# Patient Record
Sex: Male | Born: 1990 | Race: White | Hispanic: No | Marital: Single | Smoking: Current every day smoker
Health system: Southern US, Community
[De-identification: ages and names within clinical notes are randomized; demographics above are authoritative.]

## PROBLEM LIST (undated history)

## (undated) HISTORY — PX: SPLENECTOMY: SUR1306

## (undated) HISTORY — PX: SPINE SURGERY: SHX786

---
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
## 2011-03-18 LAB — COMPREHENSIVE METABOLIC PANEL
AST: 28
BUN: 8
Calcium: 9.7
Chloride: 104
Creatinine, Ser: 0.94
Potassium: 3.5
Total Protein: 6.8

## 2011-03-18 LAB — CBC
HCT: 44.3
Hemoglobin: 15.3
MCHC: 34.5
RBC: 4.82
WBC: 16.2 — ABNORMAL HIGH

## 2011-03-18 LAB — DIFFERENTIAL
Eosinophils Relative: 0
Lymphs Abs: 1.2
Monocytes Relative: 3
Neutro Abs: 14.4 — ABNORMAL HIGH
Neutrophils Relative %: 89 — ABNORMAL HIGH

## 2011-04-01 LAB — DIFFERENTIAL
Basophils Absolute: 0
Basophils Absolute: 0
Basophils Relative: 0
Eosinophils Absolute: 0
Eosinophils Relative: 0
Eosinophils Relative: 2
Eosinophils Relative: 5
Lymphocytes Relative: 10 — ABNORMAL LOW
Lymphocytes Relative: 23 — ABNORMAL LOW
Lymphocytes Relative: 24
Lymphs Abs: 1.7
Lymphs Abs: 2.3
Monocytes Absolute: 0.5
Monocytes Absolute: 1.1
Monocytes Relative: 5
Monocytes Relative: 7
Neutro Abs: 19.9 — ABNORMAL HIGH
Neutro Abs: 4.1
Neutrophils Relative %: 67
Neutrophils Relative %: 85 — ABNORMAL HIGH

## 2011-04-01 LAB — CBC
HCT: 25.4 — ABNORMAL LOW
HCT: 25.6 — ABNORMAL LOW
HCT: 28.1 — ABNORMAL LOW
HCT: 40.1
Hemoglobin: 11 — ABNORMAL LOW
Hemoglobin: 13.9
Hemoglobin: 8.6 — ABNORMAL LOW
Hemoglobin: 8.9 — ABNORMAL LOW
MCHC: 34.2
MCHC: 34.5
MCHC: 34.7
MCV: 89
MCV: 90
MCV: 90.1
MCV: 93.4
Platelets: 271
Platelets: 556 — ABNORMAL HIGH
RBC: 2.81 — ABNORMAL LOW
RBC: 3.56 — ABNORMAL LOW
RBC: 4.3
RDW: 13.2
RDW: 14.7 — ABNORMAL HIGH
RDW: 15.6 — ABNORMAL HIGH
WBC: 23.3 — ABNORMAL HIGH
WBC: 7.2

## 2011-04-01 LAB — CROSSMATCH
ABO/RH(D): O POS
Antibody Screen: NEGATIVE

## 2011-04-01 LAB — HEMOGLOBIN AND HEMATOCRIT, BLOOD
HCT: 26 — ABNORMAL LOW
HCT: 33.8 — ABNORMAL LOW
Hemoglobin: 11.6 — ABNORMAL LOW
Hemoglobin: 8.8 — ABNORMAL LOW

## 2011-04-01 LAB — BASIC METABOLIC PANEL
CO2: 24
Chloride: 103
Glucose, Bld: 180 — ABNORMAL HIGH
Potassium: 3.3 — ABNORMAL LOW
Sodium: 138

## 2011-04-01 LAB — ETHANOL: Alcohol, Ethyl (B): <5

## 2011-04-01 LAB — POCT I-STAT 4, (NA,K, GLUC, HGB,HCT)
Glucose, Bld: 142 — ABNORMAL HIGH
HCT: 25 — ABNORMAL LOW
Hemoglobin: 12.2
Hemoglobin: 6.8 — CL
Hemoglobin: 8.5 — ABNORMAL LOW
Operator id: 114421
Potassium: 3.9
Sodium: 135
Sodium: 139
Sodium: 139

## 2011-12-31 ENCOUNTER — Ambulatory Visit: Payer: BC Managed Care – PPO

## 2011-12-31 ENCOUNTER — Ambulatory Visit (INDEPENDENT_AMBULATORY_CARE_PROVIDER_SITE_OTHER): Payer: BC Managed Care – PPO | Admitting: Family Medicine

## 2011-12-31 VITALS — BP 102/68 | HR 62 | Temp 98.2°F | Resp 16 | Ht 69.5 in | Wt 138.0 lb

## 2011-12-31 DIAGNOSIS — M549 Dorsalgia, unspecified: Secondary | ICD-10-CM

## 2011-12-31 DIAGNOSIS — M62838 Other muscle spasm: Secondary | ICD-10-CM

## 2011-12-31 MED ORDER — MELOXICAM 7.5 MG PO TABS: 7.5000 mg | ORAL_TABLET | Freq: Every day | ORAL | Status: DC
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
## 2011-12-31 NOTE — Patient Instructions (Signed)
Exercises as in manual, recheck in next 3-4 weeks if not improving, sooner if worse.

## 2012-01-09 ENCOUNTER — Telehealth: Payer: Self-pay

## 2012-01-09 NOTE — Telephone Encounter (Signed)
LMOM to CB--see xray report.

## 2012-01-09 NOTE — Telephone Encounter (Signed)
Patient states that he is returning our phone call regarding his xray report.      Best #: [phone]

## 2012-03-04 ENCOUNTER — Emergency Department (HOSPITAL_COMMUNITY): Payer: BC Managed Care – PPO

## 2012-03-04 ENCOUNTER — Encounter (HOSPITAL_COMMUNITY): Payer: Self-pay | Admitting: Family Medicine

## 2012-03-04 ENCOUNTER — Emergency Department (HOSPITAL_COMMUNITY)
Admission: EM | Admit: 2012-03-04 | Discharge: 2012-03-04 | Disposition: A | Discharge status: Home / Self Care | Payer: BC Managed Care – PPO | Attending: Emergency Medicine | Admitting: Emergency Medicine

## 2012-03-04 DIAGNOSIS — F17200 Nicotine dependence, unspecified, uncomplicated: Secondary | ICD-10-CM | POA: Insufficient documentation

## 2012-03-04 DIAGNOSIS — S022XXA Fracture of nasal bones, initial encounter for closed fracture: Secondary | ICD-10-CM

## 2012-03-04 DIAGNOSIS — S0990XA Unspecified injury of head, initial encounter: Secondary | ICD-10-CM

## 2012-03-04 MED ORDER — PSEUDOEPHEDRINE HCL 60 MG PO TABS: 60.0000 mg | ORAL_TABLET | Freq: Four times a day (QID) | ORAL | Status: AC | PRN

## 2012-03-04 MED ORDER — IBUPROFEN 800 MG PO TABS
800.0000 mg | ORAL_TABLET | Freq: Once | ORAL | Status: AC
  Administered 2012-03-04: 800 mg via ORAL
  Filled 2012-03-04: qty 1

## 2012-03-04 MED ORDER — OXYCODONE-ACETAMINOPHEN 5-325 MG PO TABS
2.0000 | ORAL_TABLET | Freq: Once | ORAL | Status: AC
  Administered 2012-03-04: 2 via ORAL
  Filled 2012-03-04: qty 2

## 2012-03-04 MED ORDER — TETANUS-DIPHTH-ACELL PERTUSSIS 5-2.5-18.5 LF-MCG/0.5 IM SUSP: 0.5000 mL | Freq: Once | INTRAMUSCULAR | Status: DC

## 2012-03-04 MED ORDER — NAPROXEN 500 MG PO TABS: 500.0000 mg | ORAL_TABLET | Freq: Two times a day (BID) | ORAL | Status: DC | PRN

## 2012-03-04 MED ORDER — OXYCODONE-ACETAMINOPHEN 5-325 MG PO TABS: 1.0000 | ORAL_TABLET | ORAL | Status: AC | PRN

## 2012-03-04 NOTE — ED Notes (Signed)
C-collar applied
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
## 2012-03-04 NOTE — ED Notes (Signed)
Went to bedside to medicate patient. Patient found not to have C-collar on. Patient states he removed C-collar because he just didn't like it.

## 2012-03-04 NOTE — ED Notes (Signed)
Returned from CT.

## 2012-03-04 NOTE — ED Notes (Signed)
Kohut, MD at bedside. 

## 2012-03-04 NOTE — ED Notes (Signed)
Patient transported to CT 
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
## 2012-07-06 ENCOUNTER — Ambulatory Visit (INDEPENDENT_AMBULATORY_CARE_PROVIDER_SITE_OTHER): Payer: BC Managed Care – PPO | Admitting: Physician Assistant

## 2012-07-06 VITALS — BP 97/63 | HR 90 | Temp 98.3°F | Resp 16 | Ht 69.0 in | Wt 138.6 lb

## 2012-07-06 DIAGNOSIS — R059 Cough, unspecified: Secondary | ICD-10-CM

## 2012-07-06 DIAGNOSIS — J40 Bronchitis, not specified as acute or chronic: Secondary | ICD-10-CM

## 2012-07-06 DIAGNOSIS — J3489 Other specified disorders of nose and nasal sinuses: Secondary | ICD-10-CM

## 2012-07-06 DIAGNOSIS — R05 Cough: Secondary | ICD-10-CM

## 2012-07-06 DIAGNOSIS — R0981 Nasal congestion: Secondary | ICD-10-CM

## 2012-07-06 MED ORDER — HYDROCODONE-HOMATROPINE 5-1.5 MG/5ML PO SYRP: 5.0000 mL | ORAL_SOLUTION | Freq: Three times a day (TID) | ORAL | Status: DC | PRN

## 2012-07-06 MED ORDER — AZITHROMYCIN 250 MG PO TABS: ORAL_TABLET | ORAL | Status: DC

## 2012-07-06 MED ORDER — IPRATROPIUM BROMIDE 0.03 % NA SOLN: 2.0000 | Freq: Two times a day (BID) | NASAL | Status: DC

## 2012-07-06 NOTE — Patient Instructions (Addendum)
Delsym twice daily for cough  Ibuprofen or tylenol as needed for chills Continue Mucinex as directed

## 2012-07-06 NOTE — Progress Notes (Signed)
  Subjective:    Patient ID: [name], male    DOB: 02/14/1991, 22 y.o.   MRN: [id number]  HPI 22 year old male presents with 1 week history of nasal congestion, cough, fever, chills, and body aches. States symptoms started suddenly and have persisted throughout the week. He does admit that his fever and chills were much worse at the onset, as well as a cough with burning pain in his chest.  States he does continue to have nasal congestion, postnasal drainage, rhinorrhea, and dry cough.  Denies nausea, vomiting, headache, otalgia, or dizziness. No known flu contacts.  He is in school at [employer].  He is otherwise healthy and has not other concerns today.     Review of Systems  Constitutional: Positive for chills. Negative for fever.  HENT: Positive for congestion, sore throat, rhinorrhea, postnasal drip and sinus pressure. Negative for ear pain, neck pain and neck stiffness.   Respiratory: Positive for cough. Negative for shortness of breath and wheezing.   Cardiovascular: Negative for chest pain.  Gastrointestinal: Negative for nausea, vomiting and abdominal pain.  Skin: Negative for rash.  Neurological: Negative for dizziness and headaches.  All other systems reviewed and are negative.       Objective:   Physical Exam  Constitutional: He is oriented to person, place, and time. He appears well-developed and well-nourished.  HENT:  Head: Normocephalic and atraumatic.  Right Ear: Hearing, tympanic membrane, external ear and ear canal normal.  Left Ear: Hearing, tympanic membrane, external ear and ear canal normal.  Mouth/Throat: Uvula is midline and mucous membranes are normal. Posterior oropharyngeal erythema (no tonsillar swelling) present. No oropharyngeal exudate, posterior oropharyngeal edema or tonsillar abscesses.  Eyes: Conjunctivae normal are normal.  Neck: Normal range of motion. Neck supple.  Cardiovascular: Normal rate, regular rhythm and normal heart  sounds.   Pulmonary/Chest: Effort normal and breath sounds normal.  Lymphadenopathy:    He has no cervical adenopathy.  Neurological: He is alert and oriented to person, place, and time.  Psychiatric: He has a normal mood and affect. His behavior is normal. Judgment and thought content normal.          Assessment & Plan:   1. Cough  HYDROcodone-homatropine (HYCODAN) 5-1.5 MG/5ML syrup  2. Bronchitis  azithromycin (ZITHROMAX) 250 MG tablet  3. Nasal congestion  ipratropium (ATROVENT) 0.03 % nasal spray   Likely had influenza last week with persistent postviral cough or possible bronchitis Will go ahead and treat with Zpack Atrovent NS bid Hycodan qhs prn cough Continue Mucinex as directed Follow up if symptoms worsen or fail to improve.

## 2012-07-26 ENCOUNTER — Encounter: Payer: Self-pay | Admitting: Emergency Medicine

## 2012-07-26 ENCOUNTER — Ambulatory Visit (INDEPENDENT_AMBULATORY_CARE_PROVIDER_SITE_OTHER): Payer: BC Managed Care – PPO | Admitting: Emergency Medicine

## 2012-07-26 VITALS — BP 114/68 | HR 77 | Temp 99.2°F | Resp 18 | Wt 138.0 lb

## 2012-07-26 DIAGNOSIS — K2100 Gastro-esophageal reflux disease with esophagitis, without bleeding: Secondary | ICD-10-CM

## 2012-07-26 DIAGNOSIS — D7389 Other diseases of spleen: Secondary | ICD-10-CM

## 2012-07-26 DIAGNOSIS — L819 Disorder of pigmentation, unspecified: Secondary | ICD-10-CM

## 2012-07-26 DIAGNOSIS — S3609XA Other injury of spleen, initial encounter: Secondary | ICD-10-CM

## 2012-07-26 MED ORDER — PANTOPRAZOLE SODIUM 40 MG PO TBEC: 40.0000 mg | DELAYED_RELEASE_TABLET | Freq: Every day | ORAL | Status: DC

## 2012-07-26 NOTE — Progress Notes (Signed)
  Subjective:    Patient ID: [name], male    DOB: 04/21/1991, 22 y.o.   MRN: [id number]  HPI patient with 3 issues. He is a history of substance abuse. He denies using any drugs at the present time. His strength about once a week he denies using marijuana or any hard drugs. Problem #2 his reflux. He has significant trouble with heartburn especially after eating. He states whenever he he eats he has a burning sensation and if he tries to treat his burning sensation with food it makes it worse. He does get relief when he takes Zantac or antacids . He denies taking any nonsteroidals. Problem #3 is discoloration around his nose. He states the areas been discolored since he suffered a nasal fracture last year. Problem #4 smoking. Patient smokes one half pack a day.    Review of Systems     Objective:   Physical Exam there is skin discoloration and nasal area. TMs are clear nose normal throat was clear chest clear to auscultation and percussion abdomen shows a large midline scar there no tenderness or masses felt        Assessment & Plan:  He is advised to quit smoking. He started Protonix 40 mg one a day. He's referred to GI for consideration for endoscopy. He also is referred to dermatology to see if they can help with his skin discoloration around his nose

## 2013-05-12 ENCOUNTER — Ambulatory Visit (INDEPENDENT_AMBULATORY_CARE_PROVIDER_SITE_OTHER): Payer: BC Managed Care – PPO | Admitting: Emergency Medicine

## 2013-05-12 ENCOUNTER — Ambulatory Visit: Payer: BC Managed Care – PPO

## 2013-05-12 VITALS — BP 126/84 | HR 107 | Temp 98.3°F | Resp 16 | Ht 69.5 in | Wt 139.8 lb

## 2013-05-12 DIAGNOSIS — S6991XA Unspecified injury of right wrist, hand and finger(s), initial encounter: Secondary | ICD-10-CM

## 2013-05-12 DIAGNOSIS — S6990XA Unspecified injury of unspecified wrist, hand and finger(s), initial encounter: Secondary | ICD-10-CM

## 2013-05-12 DIAGNOSIS — S61409A Unspecified open wound of unspecified hand, initial encounter: Secondary | ICD-10-CM

## 2013-05-12 DIAGNOSIS — S61411A Laceration without foreign body of right hand, initial encounter: Secondary | ICD-10-CM

## 2013-05-12 DIAGNOSIS — M79609 Pain in unspecified limb: Secondary | ICD-10-CM

## 2013-05-12 NOTE — Progress Notes (Signed)
Patient ID: ELOHIM BRUNE MRN: [id number], DOB: January 22, 1991, 22 y.o. Date of Encounter: 05/12/2013, 6:16 PM   PROCEDURE NOTE: Verbal consent obtained. Sterile technique employed. Numbing: Anesthesia obtained with 2% lidocaine plain.   Cleansed with soap and water. Irrigated.  Wound explored, no deep structures involved, no foreign bodies.   Extensor tendon visualized, but no laceration of tendon noted.   Wound repaired with # 2 HM and #4 SI sutures.  Hemostasis obtained. Wound cleansed and dressed.  Wound care instructions including precautions covered with patient. Handout given.  Anticipate suture removal in 10 days  Rhoderick Moody, PA-C 05/12/2013 6:16 PM

## 2013-05-12 NOTE — Patient Instructions (Addendum)
WOUND CARE Please return in 10 days to have your stitches/staples removed or sooner if you have concerns. Marland Kitchen Keep area clean and dry for 24 hours. Do not remove bandage, if applied. . After 24 hours, remove bandage and wash wound gently with mild soap and warm water. Reapply a new bandage after cleaning wound, if directed. . Continue daily cleansing with soap and water until stitches/staples are removed. . Do not apply any ointments or creams to the wound while stitches/staples are in place, as this may cause delayed healing. . Notify the office if you experience any of the following signs of infection: Swelling, redness, pus drainage, streaking, fever >101.0 F . Notify the office if you experience excessive bleeding that does not stop after 15-20 minutes of constant, firm pressure.         Laceration Care, Adult A laceration is a cut or lesion that goes through all layers of the skin and into the tissue just beneath the skin. TREATMENT  Some lacerations may not require closure. Some lacerations may not be able to be closed due to an increased risk of infection. It is important to see your caregiver as soon as possible after an injury to minimize the risk of infection and maximize the opportunity for successful closure. If closure is appropriate, pain medicines may be given, if needed. The wound will be cleaned to help prevent infection. Your caregiver will use stitches (sutures), staples, wound glue (adhesive), or skin adhesive strips to repair the laceration. These tools bring the skin edges together to allow for faster healing and a better cosmetic outcome. However, all wounds will heal with a scar. Once the wound has healed, scarring can be minimized by covering the wound with sunscreen during the day for 1 full year. HOME CARE INSTRUCTIONS  For sutures or staples:  Keep the wound clean and dry.  If you were given a bandage (dressing), you should change it at least once a  day. Also, change the dressing if it becomes wet or dirty, or as directed by your caregiver.  Wash the wound with soap and water 2 times a day. Rinse the wound off with water to remove all soap. Pat the wound dry with a clean towel.  After cleaning, apply a thin layer of the antibiotic ointment as recommended by your caregiver. This will help prevent infection and keep the dressing from sticking.  You may shower as usual after the first 24 hours. Do not soak the wound in water until the sutures are removed.  Only take over-the-counter or prescription medicines for pain, discomfort, or fever as directed by your caregiver.  Get your sutures or staples removed as directed by your caregiver. For skin adhesive strips:  Keep the wound clean and dry.  Do not get the skin adhesive strips wet. You may bathe carefully, using caution to keep the wound dry.  If the wound gets wet, pat it dry with a clean towel.  Skin adhesive strips will fall off on their own. You may trim the strips as the wound heals. Do not remove skin adhesive strips that are still stuck to the wound. They will fall off in time. For wound adhesive:  You may briefly wet your wound in the shower or bath. Do not soak or scrub the wound. Do not swim. Avoid periods of heavy perspiration until the skin adhesive has fallen off on its own. After showering or bathing, gently pat the wound dry with a clean towel.  Do not  apply liquid medicine, cream medicine, or ointment medicine to your wound while the skin adhesive is in place. This may loosen the film before your wound is healed.  If a dressing is placed over the wound, be careful not to apply tape directly over the skin adhesive. This may cause the adhesive to be pulled off before the wound is healed.  Avoid prolonged exposure to sunlight or tanning lamps while the skin adhesive is in place. Exposure to ultraviolet light in the first year will darken the scar.  The skin adhesive will  usually remain in place for 5 to 10 days, then naturally fall off the skin. Do not pick at the adhesive film. You may need a tetanus shot if:  You cannot remember when you had your last tetanus shot.  You have never had a tetanus shot. If you get a tetanus shot, your arm may swell, get red, and feel warm to the touch. This is common and not a problem. If you need a tetanus shot and you choose not to have one, there is a rare chance of getting tetanus. Sickness from tetanus can be serious. SEEK MEDICAL CARE IF:   You have redness, swelling, or increasing pain in the wound.  You see a red line that goes away from the wound.  You have yellowish-white fluid (pus) coming from the wound.  You have a fever.  You notice a bad smell coming from the wound or dressing.  Your wound breaks open before or after sutures have been removed.  You notice something coming out of the wound such as wood or glass.  Your wound is on your hand or foot and you cannot move a finger or toe. SEEK IMMEDIATE MEDICAL CARE IF:   Your pain is not controlled with prescribed medicine.  You have severe swelling around the wound causing pain and numbness or a change in color in your arm, hand, leg, or foot.  Your wound splits open and starts bleeding.  You have worsening numbness, weakness, or loss of function of any joint around or beyond the wound.  You develop painful lumps near the wound or on the skin anywhere on your body. MAKE SURE YOU:   Understand these instructions.  Will watch your condition.  Will get help right away if you are not doing well or get worse. Document Released: 06/08/2005 Document Revised: 08/31/2011 Document Reviewed: 12/02/2010 [facility] Patient Information 2014 [place], [place].

## 2013-05-12 NOTE — Progress Notes (Signed)
[facility] [address] [place] [address] [phone]  Date:  05/12/2013   Name:  [name]   DOB:  [birth year]   MRN:  [id number]  PCP:  No primary provider on file.    Chief Complaint: Finger Injury   History of Present Illness:  [name] is a 22 y.o. very pleasant male patient who presents with the following:  The patient was breaking up sheet rock with a hammer in his left hand and struck him in his right second MCP joint.  Up to date on his tetanus.  No improvement with over the counter medications or other home remedies. Denies other complaint or health concern today.   Patient Active Problem List   Diagnosis Date Noted  . 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 Reflux esophagitis 07/26/2012    No past medical history on file.  Past Surgical History  Procedure Laterality Date  . Splenectomy    . Spine surgery      History  Substance Use Topics  . Smoking status: Current Every Day Smoker -- 0.50 packs/day for 5 years    Types: Cigarettes  . Smokeless tobacco: Not on file  . Alcohol Use: 3.6 oz/week    6 Cans of beer per week    No family history on file.  No Known Allergies  Medication list has been reviewed and updated.  Current Outpatient Prescriptions on File Prior to Visit  Medication Sig Dispense Refill  . pantoprazole (PROTONIX) 40 MG tablet Take 1 tablet (40 mg total) by mouth daily.  30 tablet  3   No current facility-administered medications on file prior to visit.    Review of Systems:  As per HPI, otherwise negative.    Physical Examination: Filed Vitals:   05/12/13 1708  BP: 126/84  Pulse: 107  Temp: 98.3 F (36.8 C)  Resp: 16   Filed Vitals:   05/12/13 1708  Height: 5' 9.5" (1.765 m)  Weight: 139 lb 12.8 oz (63.413 kg)   Body mass index is 20.36 kg/(m^2). Ideal Body Weight: Weight in (lb) to have BMI = 25: 171.4   GEN: WDWN, NAD, Non-toxic, Alert & Oriented x 3 HEENT: Atraumatic, Normocephalic.   Ears and Nose: No external deformity. EXTR: No clubbing/cyanosis/edema NEURO: Normal gait.  PSYCH: Normally interactive. Conversant. Not depressed or anxious appearing.  Calm demeanor.  RIGHT HAND:  3 corner laceration with proximal based flap of second MCP joint.  Clinically NATI.  No deformity.  Full ROM.    Assessment and Plan: Laceration hand  Signed,  Phillips Odor, MD   [facility] reading (PRIMARY) by  Dr. Dareen Piano.   Negative xray.

## 2013-05-15 ENCOUNTER — Telehealth: Payer: Self-pay

## 2013-05-15 NOTE — Telephone Encounter (Signed)
Note provided. Called her to advise.

## 2013-05-15 NOTE — Telephone Encounter (Signed)
Patient is requesting two additional out of work days for his employer.   Monday and Tuesday of this week.   [phone]

## 2013-09-09 ENCOUNTER — Emergency Department (HOSPITAL_COMMUNITY)
Admission: EM | Admit: 2013-09-09 | Discharge: 2013-09-09 | Disposition: A | Discharge status: Home / Self Care | Payer: BC Managed Care – PPO | Attending: Emergency Medicine | Admitting: Emergency Medicine

## 2013-09-09 ENCOUNTER — Emergency Department (HOSPITAL_COMMUNITY): Payer: BC Managed Care – PPO

## 2013-09-09 ENCOUNTER — Encounter (HOSPITAL_COMMUNITY): Payer: Self-pay | Admitting: Emergency Medicine

## 2013-09-09 DIAGNOSIS — S0081XA Abrasion of other part of head, initial encounter: Secondary | ICD-10-CM

## 2013-09-09 DIAGNOSIS — S42009A Fracture of unspecified part of unspecified clavicle, initial encounter for closed fracture: Secondary | ICD-10-CM | POA: Insufficient documentation

## 2013-09-09 DIAGNOSIS — IMO0002 Reserved for concepts with insufficient information to code with codable children: Secondary | ICD-10-CM | POA: Insufficient documentation

## 2013-09-09 DIAGNOSIS — Z23 Encounter for immunization: Secondary | ICD-10-CM | POA: Insufficient documentation

## 2013-09-09 DIAGNOSIS — S42001A Fracture of unspecified part of right clavicle, initial encounter for closed fracture: Secondary | ICD-10-CM

## 2013-09-09 DIAGNOSIS — Y929 Unspecified place or not applicable: Secondary | ICD-10-CM | POA: Insufficient documentation

## 2013-09-09 DIAGNOSIS — Y9323 Activity, snow (alpine) (downhill) skiing, snow boarding, sledding, tobogganing and snow tubing: Secondary | ICD-10-CM | POA: Insufficient documentation

## 2013-09-09 DIAGNOSIS — F17200 Nicotine dependence, unspecified, uncomplicated: Secondary | ICD-10-CM | POA: Insufficient documentation

## 2013-09-09 MED ORDER — HYDROCODONE-ACETAMINOPHEN 5-325 MG PO TABS: 1.0000 | ORAL_TABLET | Freq: Four times a day (QID) | ORAL | Status: DC | PRN

## 2013-09-09 MED ORDER — TETANUS-DIPHTH-ACELL PERTUSSIS 5-2.5-18.5 LF-MCG/0.5 IM SUSP
0.5000 mL | Freq: Once | INTRAMUSCULAR | Status: AC
  Administered 2013-09-09: 0.5 mL via INTRAMUSCULAR
  Filled 2013-09-09: qty 0.5

## 2013-09-09 MED ORDER — HYDROCODONE-ACETAMINOPHEN 5-325 MG PO TABS
2.0000 | ORAL_TABLET | Freq: Once | ORAL | Status: AC
  Administered 2013-09-09: 2 via ORAL
  Filled 2013-09-09: qty 2

## 2013-09-09 NOTE — ED Notes (Addendum)
Pt states he was snowboarding and fell off his board. Pt states that he drove home with right shoulder pain and possible collar-broken. Pt states that he also believes that his board hit his head. Pt states that he took 800mg  of ibuprofen.

## 2013-09-09 NOTE — Discharge Instructions (Signed)
Wear shoulder support. Ice/coldpacks to sore area.  Take motrin or aleve as need for pain. You may also take hydrocodone as need for pain. No driving for the next 6 hours or when taking hydrocodone. Also, do not take tylenol or acetaminophen containing medication when taking hydrocodone. Follow up with orthopedist in the next few days - see referral - call office Monday morning to arrange appointment time. Return to ER if worse, new symptoms, severe headache, neck pain, trouble breathing, other concern.    Clavicle Fracture A clavicle fracture is a break in the collarbone. This is a common injury, especially in children. Collarbones do not harden until around the age of 60. Most collarbone fractures are treated with a simple arm sling. In some cases a figure-of-eight splint is used to help hold the broken bones in position. Although not often needed, surgery may be required if the bone fragments are not in the correct position (displaced).  HOME CARE INSTRUCTIONS   Apply ice to the injury for 15-20 minutes each hour while awake for 2 days. Put the ice in a plastic bag and place a towel between the bag of ice and your skin.  Wear the sling or splint constantly for as long as directed by your caregiver. You may remove the sling or splint for bathing or showering. Be sure to keep your shoulder in the same place as when the sling or splint is on. Do not lift your arm.  If a figure-of-eight splint is applied, it must be tightened by another person every day. Tighten it enough to keep the shoulders held back. Allow enough room to place the index finger between the body and strap. Loosen the splint immediately if you feel numbness or tingling in your hands.  Only take over-the-counter or prescription medicines for pain, discomfort, or fever as directed by your caregiver.  Avoid activities that irritate or increase the pain for 4 to 6 weeks after surgery.  Follow all instructions for follow-up with  your caregiver. This includes any referrals, physical therapy, and rehabilitation. Any delay in obtaining necessary care could result in a delay or failure of the injury to heal properly. SEEK MEDICAL CARE IF:  You have pain and swelling that are not relieved with medications. SEEK IMMEDIATE MEDICAL CARE IF:  Your arm is numb, cold, or pale, even when the splint is loose. MAKE SURE YOU:   Understand these instructions.  Will watch your condition.  Will get help right away if you are not doing well or get worse. Document Released: 03/18/2005 Document Revised: 08/31/2011 Document Reviewed: 01/12/2008 [facility] Patient Information 2014 [place], [place].    Head Injury, Adult You have received a head injury. It does not appear serious at this time. Headaches and vomiting are common following head injury. It should be easy to awaken from sleeping. Sometimes it is necessary for you to stay in the emergency department for a while for observation. Sometimes admission to the hospital may be needed. After injuries such as yours, most problems occur within the first 24 hours, but side effects may occur up to 7 10 days after the injury. It is important for you to carefully monitor your condition and contact your health care provider or seek immediate medical care if there is a change in your condition. WHAT ARE THE TYPES OF HEAD INJURIES? Head injuries can be as minor as a bump. Some head injuries can be more severe. More severe head injuries include:  A jarring injury to the brain (  concussion).  A bruise of the brain (contusion). This mean there is bleeding in the brain that can cause swelling.  A cracked skull (skull fracture).  Bleeding in the brain that collects, clots, and forms a bump (hematoma). WHAT CAUSES A HEAD INJURY? A serious head injury is most likely to happen to someone who is in a car wreck and is not wearing a seat belt. Other causes of major head injuries include bicycle or  motorcycle accidents, sports injuries, and falls. HOW ARE HEAD INJURIES DIAGNOSED? A complete history of the event leading to the injury and your current symptoms will be helpful in diagnosing head injuries. Many times, pictures of the brain, such as CT or MRI are needed to see the extent of the injury. Often, an overnight hospital stay is necessary for observation.  WHEN SHOULD I SEEK IMMEDIATE MEDICAL CARE?  You should get help right away if:  You have confusion or drowsiness.  You feel sick to your stomach (nauseous) or have continued, forceful vomiting.  You have dizziness or unsteadiness that is getting worse.  You have severe, continued headaches not relieved by medicine. Only take over-the-counter or prescription medicines for pain, fever, or discomfort as directed by your health care provider.  You do not have normal function of the arms or legs or are unable to walk.  You notice changes in the black spots in the center of the colored part of your eye (pupil).  You have a clear or bloody fluid coming from your nose or ears.  You have a loss of vision. During the next 24 hours after the injury, you must stay with someone who can watch you for the warning signs. This person should contact local emergency services (911 in the [place] if you have seizures, you become unconscious, or you are unable to wake up. HOW CAN I PREVENT A HEAD INJURY IN THE FUTURE? The most important factor for preventing major head injuries is avoiding motor vehicle accidents. To minimize the potential for damage to your head, it is crucial to wear seat belts while riding in motor vehicles. Wearing helmets while bike riding and playing collision sports (like football) is also helpful. Also, avoiding dangerous activities around the house will further help reduce your risk of head injury.  WHEN CAN I RETURN TO NORMAL ACTIVITIES AND ATHLETICS? You should be reevaluated by your health care provider before returning to  these activities. If you have any of the following symptoms, you should not return to activities or contact sports until 1 week after the symptoms have stopped:  Persistent headache.  Dizziness or vertigo.  Poor attention and concentration.  Confusion.  Memory problems.  Nausea or vomiting.  Fatigue or tire easily.  Irritability.  Intolerant of bright lights or loud noises.  Anxiety or depression.  Disturbed sleep. MAKE SURE YOU:   Understand these instructions.  Will watch your condition.  Will get help right away if you are not doing well or get worse. Document Released: 06/08/2005 Document Revised: 03/29/2013 Document Reviewed: 02/13/2013 [facility] Patient Information 2014 [place], [place].    Abrasion An abrasion is a cut or scrape of the skin. Abrasions do not extend through all layers of the skin and most heal within 10 days. It is important to care for your abrasion properly to prevent infection. CAUSES  Most abrasions are caused by falling on, or gliding across, the ground or other surface. When your skin rubs on something, the outer and inner layer of skin rubs off,  causing an abrasion. DIAGNOSIS  Your caregiver will be able to diagnose an abrasion during a physical exam.  TREATMENT  Your treatment depends on how large and deep the abrasion is. Generally, your abrasion will be cleaned with water and a mild soap to remove any dirt or debris. An antibiotic ointment may be put over the abrasion to prevent an infection. A bandage (dressing) may be wrapped around the abrasion to keep it from getting dirty.  You may need a tetanus shot if:  You cannot remember when you had your last tetanus shot.  You have never had a tetanus shot.  The injury broke your skin. If you get a tetanus shot, your arm may swell, get red, and feel warm to the touch. This is common and not a problem. If you need a tetanus shot and you choose not to have one, there is a rare chance of  getting tetanus. Sickness from tetanus can be serious.  HOME CARE INSTRUCTIONS   If a dressing was applied, change it at least once a day or as directed by your caregiver. If the bandage sticks, soak it off with warm water.   Wash the area with water and a mild soap to remove all the ointment 2 times a day. Rinse off the soap and pat the area dry with a clean towel.   Reapply any ointment as directed by your caregiver. This will help prevent infection and keep the bandage from sticking. Use gauze over the wound and under the dressing to help keep the bandage from sticking.   Change your dressing right away if it becomes wet or dirty.   Only take over-the-counter or prescription medicines for pain, discomfort, or fever as directed by your caregiver.   Follow up with your caregiver within 24 48 hours for a wound check, or as directed. If you were not given a wound-check appointment, look closely at your abrasion for redness, swelling, or pus. These are signs of infection. SEEK IMMEDIATE MEDICAL CARE IF:   You have increasing pain in the wound.   You have redness, swelling, or tenderness around the wound.   You have pus coming from the wound.   You have a fever or persistent symptoms for more than 2 3 days.  You have a fever and your symptoms suddenly get worse.  You have a bad smell coming from the wound or dressing.  MAKE SURE YOU:   Understand these instructions.  Will watch your condition.  Will get help right away if you are not doing well or get worse. Document Released: 03/18/2005 Document Revised: 05/25/2012 Document Reviewed: 05/12/2011 [facility] Patient Information 2014 [place], [place].   Cryotherapy Cryotherapy means treatment with cold. Ice or gel packs can be used to reduce both pain and swelling. Ice is the most helpful within the first 24 to 48 hours after an injury or flareup from overusing a muscle or joint. Sprains, strains, spasms, burning pain, shooting  pain, and aches can all be eased with ice. Ice can also be used when recovering from surgery. Ice is effective, has very few side effects, and is safe for most people to use. PRECAUTIONS  Ice is not a safe treatment option for people with:  Raynaud's phenomenon. This is a condition affecting small blood vessels in the extremities. Exposure to cold may cause your problems to return.  Cold hypersensitivity. There are many forms of cold hypersensitivity, including:  Cold urticaria. Red, itchy hives appear on the skin when the tissues  begin to warm after being iced.  Cold erythema. This is a red, itchy rash caused by exposure to cold.  Cold hemoglobinuria. Red blood cells break down when the tissues begin to warm after being iced. The hemoglobin that carry oxygen are passed into the urine because they cannot combine with blood proteins fast enough.  Numbness or altered sensitivity in the area being iced. If you have any of the following conditions, do not use ice until you have discussed cryotherapy with your caregiver:  Heart conditions, such as arrhythmia, angina, or chronic heart disease.  High blood pressure.  Healing wounds or open skin in the area being iced.  Current infections.  Rheumatoid arthritis.  Poor circulation.  Diabetes. Ice slows the blood flow in the region it is applied. This is beneficial when trying to stop inflamed tissues from spreading irritating chemicals to surrounding tissues. However, if you expose your skin to cold temperatures for too long or without the proper protection, you can damage your skin or nerves. Watch for signs of skin damage due to cold. HOME CARE INSTRUCTIONS Follow these tips to use ice and cold packs safely.  Place a dry or damp towel between the ice and skin. A damp towel will cool the skin more quickly, so you may need to shorten the time that the ice is used.  For a more rapid response, add gentle compression to the ice.  Ice for no  more than 10 to 20 minutes at a time. The bonier the area you are icing, the less time it will take to get the benefits of ice.  Check your skin after 5 minutes to make sure there are no signs of a poor response to cold or skin damage.  Rest 20 minutes or more in between uses.  Once your skin is numb, you can end your treatment. You can test numbness by very lightly touching your skin. The touch should be so light that you do not see the skin dimple from the pressure of your fingertip. When using ice, most people will feel these normal sensations in this order: cold, burning, aching, and numbness.  Do not use ice on someone who cannot communicate their responses to pain, such as small children or people with dementia. HOW TO MAKE AN ICE PACK Ice packs are the most common way to use ice therapy. Other methods include ice massage, ice baths, and cryo-sprays. Muscle creams that cause a cold, tingly feeling do not offer the same benefits that ice offers and should not be used as a substitute unless recommended by your caregiver. To make an ice pack, do one of the following:  Place crushed ice or a bag of frozen vegetables in a sealable plastic bag. Squeeze out the excess air. Place this bag inside another plastic bag. Slide the bag into a pillowcase or place a damp towel between your skin and the bag.  Mix 3 parts water with 1 part rubbing alcohol. Freeze the mixture in a sealable plastic bag. When you remove the mixture from the freezer, it will be slushy. Squeeze out the excess air. Place this bag inside another plastic bag. Slide the bag into a pillowcase or place a damp towel between your skin and the bag. SEEK MEDICAL CARE IF:  You develop white spots on your skin. This may give the skin a blotchy (mottled) appearance.  Your skin turns blue or pale.  Your skin becomes waxy or hard.  Your swelling gets worse. MAKE SURE YOU:  Understand these instructions.  Will watch your  condition.  Will get help right away if you are not doing well or get worse. Document Released: 02/02/2011 Document Revised: 08/31/2011 Document Reviewed: 02/02/2011 [facility] Patient Information 2014 [place], [place].

## 2013-09-09 NOTE — ED Provider Notes (Addendum)
CSN: [id number]     Arrival date & time 09/09/13  2041 History   First MD Initiated Contact with Patient 09/09/13 2136     Chief Complaint  Patient presents with  . Shoulder Injury     (Consider location/radiation/quality/duration/timing/severity/associated sxs/prior Treatment) Patient is a 23 y.o. male presenting with shoulder injury. The history is provided by the patient.  Shoulder Injury Pertinent negatives include no chest pain, no abdominal pain, no headaches and no shortness of breath.  pt c/o injury to right clavicle while snowboarding this evening. Pt c/o constant, dull, moderate, non radiating pain to medial aspect clavicle. No shoulder or arm pain. No radicular pain. Hit head/forehead, no loc. Has been ambulatory under own power, [place] from ski [place].  No headache. No neck or back pain. No numbness/weakness. Abrasions to forehead, unsure of last tetanus.   History reviewed. No pertinent past medical history. Past Surgical History  Procedure Laterality Date  . Splenectomy    . Spine surgery     History reviewed. No pertinent family history. History  Substance Use Topics  . Smoking status: Current Every Day Smoker -- 0.50 packs/day for 5 years    Types: Cigarettes  . Smokeless tobacco: Not on file  . Alcohol Use: 3.6 oz/week    6 Cans of beer per week    Review of Systems  Constitutional: Negative for fever.  HENT: Negative for nosebleeds.   Eyes: Negative for redness.  Respiratory: Negative for shortness of breath.   Cardiovascular: Negative for chest pain.  Gastrointestinal: Negative for vomiting and abdominal pain.  Genitourinary: Negative for flank pain.  Musculoskeletal: Negative for back pain and neck pain.  Skin: Negative for rash.  Neurological: Negative for headaches.  Hematological: Does not bruise/bleed easily.  Psychiatric/Behavioral: Negative for confusion.      Allergies  Review of patient's allergies indicates no known allergies.  Home  Medications   Current Outpatient Rx  Name  Route  Sig  Dispense  Refill  . ibuprofen (ADVIL,MOTRIN) 200 MG tablet   Oral   Take 200-800 mg by mouth daily as needed (pain).          BP 121/78  Pulse 95  Temp(Src) 98 F (36.7 C) (Oral)  Resp 16  Ht 5\' 9"  (1.753 m)  Wt 136 lb 5 oz (61.831 kg)  BMI 20.12 kg/m2  SpO2 97% Physical Exam  Nursing note and vitals reviewed. Constitutional: He is oriented to person, place, and time. He appears well-developed and well-nourished. No distress.  HENT:  Nose: Nose normal.  Abrasion to forehead. Facial bones/orbits intact.   Eyes: Conjunctivae are normal. Pupils are equal, round, and reactive to light.  Neck: Normal range of motion. Neck supple. No tracheal deviation present.  Cardiovascular: Normal rate, regular rhythm and normal heart sounds.   Pulmonary/Chest: Effort normal and breath sounds normal. No accessory muscle usage. No respiratory distress. He exhibits tenderness.  tenderness over medial aspect right clavicle  Abdominal: Soft. Bowel sounds are normal. He exhibits no distension and no mass. There is no tenderness. There is no rebound and no guarding.  Musculoskeletal: Normal range of motion.  CTLS spine, non tender, aligned, no step off. Good  rom bil ext. Other than right shoulder and clavicle tenderness, no other focal bony tenderness. Distal pulses palp.   Neurological: He is alert and oriented to person, place, and time.  Motor intact bil. sens intact. Steady gait.   Skin: Skin is warm and dry. He is not diaphoretic.  Psychiatric:  He has a normal mood and affect.    ED Course  Procedures (including critical care time)  Dg Clavicle Right  09/09/2013   CLINICAL DATA:  Injury to right shoulder while snowboarding. Pain and swelling over the medial right clavicle.  EXAM: RIGHT CLAVICLE - 2+ VIEWS  COMPARISON:  Right shoulder x-rays obtained concurrently.  FINDINGS: Comminuted fracture involving the proximal right clavicle at  the sternoclavicular joint with inferior displacement of the head of the clavicle relative to the remaining shaft. No other fractures involving the clavicle. Acromioclavicular joint intact.  IMPRESSION: Comminuted fracture involving the proximal right clavicle at the sternoclavicular joint with inferior displacement of the head of the clavicle relative to the remaining shaft.   Electronically Signed   By: Hulan Saas M.D.   On: 09/09/2013 23:14   Dg Shoulder Right  09/09/2013   CLINICAL DATA:  Injured right shoulder, snowboarding injury. Pain and swelling over the medial clavicle.  EXAM: RIGHT SHOULDER - 2+ VIEW  COMPARISON:  Right clavicle x-rays obtained concurrently.  FINDINGS: Comminuted fracture involving the proximal right clavicle at the sternoclavicular joint with inferior displacement of the head of the clavicle relative to the remaining shaft. Acromioclavicular joint intact. Glenohumeral joint intact. No fractures elsewhere.  IMPRESSION: Comminuted fracture involving the proximal right clavicle at the sternoclavicular joint with inferior displacement of the head of the clavicle relative to the remaining shaft.   Electronically Signed   By: Hulan Saas M.D.   On: 09/09/2013 23:13     MDM  Pt has ride, does not have to drive.   vicodin po.  XrGustavus Bryant.  Reviewed nursing notes and prior charts for additional history.   Shoulder immmob right.  Recheck pt comfortable, breathing comfortably, chest cta.   Pt appears stable for d/c. Ortho f/u in the next few days.    Suzi Roots, MD 09/09/13 1610  Suzi Roots, MD 09/18/13 9604  Suzi Roots, MD 09/20/13 [phone]

## 2013-09-13 ENCOUNTER — Other Ambulatory Visit: Payer: Self-pay | Admitting: Sports Medicine

## 2013-09-13 DIAGNOSIS — S42013A Anterior displaced fracture of sternal end of unspecified clavicle, initial encounter for closed fracture: Secondary | ICD-10-CM

## 2013-09-14 ENCOUNTER — Ambulatory Visit
Admission: RE | Admit: 2013-09-14 | Discharge: 2013-09-14 | Disposition: A | Discharge status: Home / Self Care | Payer: BC Managed Care – PPO | Source: Ambulatory Visit | Attending: Sports Medicine | Admitting: Sports Medicine

## 2013-09-14 DIAGNOSIS — S42013A Anterior displaced fracture of sternal end of unspecified clavicle, initial encounter for closed fracture: Secondary | ICD-10-CM

## 2013-12-04 ENCOUNTER — Ambulatory Visit (INDEPENDENT_AMBULATORY_CARE_PROVIDER_SITE_OTHER): Payer: BC Managed Care – PPO | Admitting: Family Medicine

## 2013-12-04 VITALS — BP 100/68 | HR 63 | Temp 97.9°F | Resp 16 | Ht 69.0 in | Wt 140.0 lb

## 2013-12-04 DIAGNOSIS — H5789 Other specified disorders of eye and adnexa: Secondary | ICD-10-CM

## 2013-12-04 DIAGNOSIS — H109 Unspecified conjunctivitis: Secondary | ICD-10-CM

## 2013-12-04 MED ORDER — OFLOXACIN 0.3 % OP SOLN: OPHTHALMIC | Status: DC

## 2013-12-04 NOTE — Progress Notes (Signed)
Subjective: 23 year old man who was cleaning up at the end of the day at work with sweeping with a broom and dust mop. He got feeling pain in his right eye and felt like he had some drainage there. He went and irrigated it multiple times. He was closing up at work, so he went on home and flushes a few more times. He continues to have discomfort and redness in the right mastoid came on in to the office to get checked. Although he was sleeping and was aware that there was dust that could have gotten in his eye. He does not know specifically of dust blowing up into the eye. It is been hurting him. He can still see well. He does not wear any glasses or contacts. He has not had problems in the past. He is been very healthy.  He did not follow this on workers compensation since he was last when at work it was a later in the evening that he decided to come in and get checked. Advise him that this probably should be on workers compensation, due to the circumstances.  Objective: No acute distress. Has mild nasal congestion. His right eye is red, left minimally red. He says he has been rubbing his eyes some because of irritation. GI was examined and funduscopy was normal. He had no grossly visible abrasions. No foreign objects could be found, with dilated subverted and nothing found. Topical anesthetic drops were applied, and fluorescein dye was placed in the eye. It was well irrigated. There was a tiny bit of yellow looking drainage on the medial epicanthus. The black light was used to examine the eye and no abrasions could be appreciated. His vision screening is good today.  Assessment: Right red eye, foreign body irritation versus possible early conjunctivitis  Plan: Ofloxacin eyedrops Returned more if worse Returned in 2 days if not improved.

## 2013-12-04 NOTE — Patient Instructions (Signed)
Use eyedrops as directed  Return tomorrow if worse, otherwise return in 2 days if not improving.  If it improves, continued to use the eyedrops until the feels well for a couple of days.  Advise switching the visit to a workers compensation visit. Probably this was caused by dust coming up.

## 2015-03-03 ENCOUNTER — Emergency Department (HOSPITAL_COMMUNITY): Payer: BLUE CROSS/BLUE SHIELD

## 2015-03-03 ENCOUNTER — Emergency Department (HOSPITAL_COMMUNITY)
Admission: EM | Admit: 2015-03-03 | Discharge: 2015-03-03 | Disposition: A | Discharge status: Home / Self Care | Payer: BLUE CROSS/BLUE SHIELD | Attending: Emergency Medicine | Admitting: Emergency Medicine

## 2015-03-03 ENCOUNTER — Encounter (HOSPITAL_COMMUNITY): Payer: Self-pay | Admitting: *Deleted

## 2015-03-03 DIAGNOSIS — Y998 Other external cause status: Secondary | ICD-10-CM | POA: Diagnosis not present

## 2015-03-03 DIAGNOSIS — W228XXA Striking against or struck by other objects, initial encounter: Secondary | ICD-10-CM | POA: Insufficient documentation

## 2015-03-03 DIAGNOSIS — S62639A Displaced fracture of distal phalanx of unspecified finger, initial encounter for closed fracture: Secondary | ICD-10-CM

## 2015-03-03 DIAGNOSIS — Z720 Tobacco use: Secondary | ICD-10-CM | POA: Insufficient documentation

## 2015-03-03 DIAGNOSIS — Y9289 Other specified places as the place of occurrence of the external cause: Secondary | ICD-10-CM | POA: Insufficient documentation

## 2015-03-03 DIAGNOSIS — Y9389 Activity, other specified: Secondary | ICD-10-CM | POA: Insufficient documentation

## 2015-03-03 DIAGNOSIS — S62397A Other fracture of fifth metacarpal bone, left hand, initial encounter for closed fracture: Secondary | ICD-10-CM | POA: Insufficient documentation

## 2015-03-03 DIAGNOSIS — S6992XA Unspecified injury of left wrist, hand and finger(s), initial encounter: Secondary | ICD-10-CM | POA: Diagnosis present

## 2015-03-03 MED ORDER — HYDROCODONE-ACETAMINOPHEN 5-325 MG PO TABS
1.0000 | ORAL_TABLET | Freq: Once | ORAL | Status: AC
Start: 2015-03-03 — End: 2015-03-03
  Administered 2015-03-03: 1 via ORAL
  Filled 2015-03-03: qty 1

## 2015-03-03 MED ORDER — HYDROCODONE-ACETAMINOPHEN 5-325 MG PO TABS: 1.0000 | ORAL_TABLET | Freq: Four times a day (QID) | ORAL | Status: DC | PRN

## 2015-03-03 NOTE — Progress Notes (Signed)
Orthopedic Tech Progress Note Patient Details:  [name] 22-Aug-1990 [id number]  Ortho Devices Type of Ortho Device: Ace wrap, Arm sling, Ulna gutter splint Ortho Device/Splint Location: LUE Ortho Device/Splint Interventions: Application   [name] 03/03/2015, 5:33 AM

## 2015-03-03 NOTE — ED Notes (Signed)
Ortho tech contacted. 

## 2015-03-03 NOTE — ED Notes (Signed)
The pt is c/o lt hand pain since he punched the ground earlier tonight.  Painful over the 5th metacarpal lt

## 2015-03-03 NOTE — Discharge Instructions (Signed)
Please see the hand surgeon for you injury. Keep the hand in the splint until then.   Cast or Splint Care Casts and splints support injured limbs and keep bones from moving while they heal.  HOME CARE  Keep the cast or splint uncovered during the drying period.  A plaster cast can take 24 to 48 hours to dry.  A fiberglass cast will dry in less than 1 hour.  Do not rest the cast on anything harder than a pillow for 24 hours.  Do not put weight on your injured limb. Do not put pressure on the cast. Wait for your doctor's approval.  Keep the cast or splint dry.  Cover the cast or splint with a plastic bag during baths or wet weather.  If you have a cast over your chest and belly (trunk), take sponge baths until the cast is taken off.  If your cast gets wet, dry it with a towel or blow dryer. Use the cool setting on the blow dryer.  Keep your cast or splint clean. Wash a dirty cast with a damp cloth.  Do not put any objects under your cast or splint.  Do not scratch the skin under the cast with an object. If itching is a problem, use a blow dryer on a cool setting over the itchy area.  Do not trim or cut your cast.  Do not take out the padding from inside your cast.  Exercise your joints near the cast as told by your doctor.  Raise (elevate) your injured limb on 1 or 2 pillows for the first 1 to 3 days. GET HELP IF:  Your cast or splint cracks.  Your cast or splint is too tight or too loose.  You itch badly under the cast.  Your cast gets wet or has a soft spot.  You have a bad smell coming from the cast.  You get an object stuck under the cast.  Your skin around the cast becomes red or sore.  You have new or more pain after the cast is put on. GET HELP RIGHT AWAY IF:  You have fluid leaking through the cast.  You cannot move your fingers or toes.  Your fingers or toes turn blue or white or are cool, painful, or puffy (swollen).  You have tingling or lose  feeling (numbness) around the injured area.  You have bad pain or pressure under the cast.  You have trouble breathing or have shortness of breath.  You have chest pain. Document Released: 10/08/2010 Document Revised: 02/08/2013 Document Reviewed: 12/15/2012 [facility] Patient Information 2015 [place], [place]. This information is not intended to replace advice given to you by your health care provider. Make sure you discuss any questions you have with your health care provider.  Finger Fracture Fractures of fingers are breaks in the bones of the fingers. There are many types of fractures. There are different ways of treating these fractures. Your health care provider will discuss the best way to treat your fracture. CAUSES Traumatic injury is the main cause of broken fingers. These include:  Injuries while playing sports.  Workplace injuries.  Falls. RISK FACTORS Activities that can increase your risk of finger fractures include:  Sports.  Workplace activities that involve machinery.  A condition called osteoporosis, which can make your bones less dense and cause them to fracture more easily. SIGNS AND SYMPTOMS The main symptoms of a broken finger are pain and swelling within 15 minutes after the injury. Other symptoms  include:  Bruising of your finger.  Stiffness of your finger.  Numbness of your finger.  Exposed bones (compound fracture) if the fracture is severe. DIAGNOSIS  The best way to diagnose a broken bone is with X-ray imaging. Additionally, your health care provider will use this X-ray image to evaluate the position of the broken finger bones.  TREATMENT  Finger fractures can be treated with:   Nonreduction--This means the bones are in place. The finger is splinted without changing the positions of the bone pieces. The splint is usually left on for about a week to 10 days. This will depend on your fracture and what your health care provider thinks.  Closed  reduction--The bones are put back into position without using surgery. The finger is then splinted.  Open reduction and internal fixation--The fracture site is opened. Then the bone pieces are fixed into place with pins or some type of hardware. This is seldom required. It depends on the severity of the fracture. HOME CARE INSTRUCTIONS   Follow your health care provider's instructions regarding activities, exercises, and physical therapy.  Only take over-the-counter or prescription medicines for pain, discomfort, or fever as directed by your health care provider. SEEK MEDICAL CARE IF: You have pain or swelling that limits the motion or use of your fingers. SEEK IMMEDIATE MEDICAL CARE IF:  Your finger becomes numb. MAKE SURE YOU:   Understand these instructions.  Will watch your condition.  Will get help right away if you are not doing well or get worse. Document Released: 09/20/2000 Document Revised: 03/29/2013 Document Reviewed: 01/18/2013 [facility] Patient Information 2015 [place], [place]. This information is not intended to replace advice given to you by your health care provider. Make sure you discuss any questions you have with your health care provider.

## 2015-03-03 NOTE — ED Notes (Signed)
Pt waiting for the ortho tech to apply a ulnar gutter splint to the lt hand fracture

## 2015-03-03 NOTE — ED Notes (Signed)
Pt waiting for the ortho tech

## 2015-03-03 NOTE — ED Notes (Signed)
i triaged this pt earlier.  No change alert no distress.  Laughing saying he has pain

## 2015-03-03 NOTE — ED Provider Notes (Signed)
CSN: [id number]   Arrival date & time 03/03/15 0125  History  This chart was scribed for Derwood Kaplan, MD by Bethel Born, ED Scribe. This patient was seen in room D31C/D31C and the patient's care was started at 3:47 AM.  Chief Complaint  Patient presents with  . Hand Injury    HPI The history is provided by the patient. No language interpreter was used.   [name] is a 24 y.o. male who presents to the Emergency Department complaining of constant left hand pain with sudden onset this morning around midnight after punching the ground. The pt got excited while watching a UFC match and punched the ground. He rates the pain 10/10 in severity. Associated symptoms include left hand swelling. He is right hand dominant.    History reviewed. No pertinent past medical history.  Past Surgical History  Procedure Laterality Date  . Splenectomy    . Spine surgery      No family history on file.  Social History  Substance Use Topics  . Smoking status: Current Every Day Smoker -- 0.50 packs/day for 5 years    Types: Cigarettes  . Smokeless tobacco: None  . Alcohol Use: 3.6 oz/week    6 Cans of beer per week     Review of Systems  Musculoskeletal:       Left hand pain and swelling   Home Medications   Prior to Admission medications   Medication Sig Start Date End Date Taking? Authorizing Provider  HYDROcodone-acetaminophen (NORCO/VICODIN) 5-325 MG per tablet Take 1 tablet by mouth every 6 (six) hours as needed for severe pain. 03/03/15   Derwood Kaplan, MD  ofloxacin (OCUFLOX) 0.3 % ophthalmic solution Use 2 drops every 3 hours for 2 days, then one drop 4 times daily in right eye 12/04/13   Peyton Najjar, MD    Allergies  Review of patient's allergies indicates no known allergies.  Triage Vitals: BP 123/84 mmHg  Pulse 92  Temp(Src) 97.7 F (36.5 C) (Oral)  Resp 18  SpO2 96%  Physical Exam  Constitutional: He is oriented to person, place, and time. He appears  well-developed and well-nourished.  HENT:  Head: Normocephalic.  Eyes: EOM are normal.  Neck: Normal range of motion.  Pulmonary/Chest: Effort normal.  Abdominal: He exhibits no distension.  Musculoskeletal: Normal range of motion.  2+ left radial and ulnar pulse Swelling over the 5th metatarsal   Neurological: He is alert and oriented to person, place, and time.  Skin: Skin is warm.  Psychiatric: He has a normal mood and affect.  Nursing note and vitals reviewed.   ED Course  Procedures   DIAGNOSTIC STUDIES: Oxygen Saturation is 96% on RA, normal by my interpretation.    COORDINATION OF CARE: 3:50 AM Discussed treatment plan which includes left hand XR and pain management with pt at bedside and pt agreed to plan.  Labs Reviewed - No data to display  Imaging Review Dg Hand Complete Left  03/03/2015   CLINICAL DATA:  24 year old male with pain over the left fifth metacarpal  EXAM: LEFT HAND - COMPLETE 3+ VIEW  COMPARISON:  None.  FINDINGS: There is fracture of the distal fifth metacarpal with volar angulation of the distal fracture fragment. Common there is soft tissue swelling over the fifth metacarpal.  IMPRESSION: Fracture of the distal fifth metacarpal.   Electronically Signed   By: Elgie Collard M.D.   On: 03/03/2015 02:56   I, Derwood Kaplan, MD, personally reviewed and evaluated  these images as part of my medical decision-making.     MDM   Final diagnoses:  Distal phalanx or phalanges, closed fracture, initial encounter     I personally performed the services described in this documentation, which was scribed in my presence. The recorded information has been reviewed and is accurate.  Pt with fracture, 5th metacarpal. Will get hand f/u. Neurovascularly intact, ulnar gutter ordered.   SPLINT APPLICATION Date/Time: 4:18 AM Authorized by: Derwood Kaplan Consent: Verbal consent obtained. Risks and benefits: risks, benefits and alternatives were  discussed Consent given by: patient Splint applied by: orthopedic technician Location details: Left hand Splint type: Ulnar gutter Supplies used: webril, ace band, tape, splint plaster material Post-procedure: The splinted body part was neurovascularly unchanged following the procedure. Patient tolerance: Patient tolerated the procedure well with no immediate complications.      Derwood Kaplan, MD 03/03/15 [id number]

## 2015-03-26 ENCOUNTER — Encounter: Payer: Self-pay | Admitting: Emergency Medicine

## 2016-09-29 ENCOUNTER — Ambulatory Visit (INDEPENDENT_AMBULATORY_CARE_PROVIDER_SITE_OTHER): Payer: BLUE CROSS/BLUE SHIELD | Admitting: Student

## 2016-09-29 VITALS — BP 112/71 | HR 73 | Temp 98.0°F | Resp 16 | Ht 69.0 in | Wt 145.0 lb

## 2016-09-29 DIAGNOSIS — K219 Gastro-esophageal reflux disease without esophagitis: Secondary | ICD-10-CM

## 2016-09-29 DIAGNOSIS — K2100 Gastro-esophageal reflux disease with esophagitis, without bleeding: Secondary | ICD-10-CM

## 2016-09-29 DIAGNOSIS — J069 Acute upper respiratory infection, unspecified: Secondary | ICD-10-CM | POA: Diagnosis not present

## 2016-09-29 MED ORDER — GUAIFENESIN-CODEINE 100-10 MG/5ML PO SOLN: 5.0000 mL | Freq: Four times a day (QID) | ORAL | 0 refills | Status: AC | PRN

## 2016-09-29 MED ORDER — PANTOPRAZOLE SODIUM 40 MG PO TBEC: 40.0000 mg | DELAYED_RELEASE_TABLET | Freq: Every day | ORAL | 1 refills | Status: DC

## 2016-09-29 NOTE — Assessment & Plan Note (Signed)
Trying pantoprazole 30 mins before breakfast.  Follow up 6 weeks if he cannot get off medication or if it is not improving.  He may need EGD at that time.  No red flag symptoms at this time.

## 2016-09-29 NOTE — Assessment & Plan Note (Signed)
Symptomatic care, mucinex, robitussin for nighttime cough.  Follow up PRN.

## 2016-09-29 NOTE — Progress Notes (Signed)
Subjective:    Patient ID: [name], male    DOB: Sep 17, 1990, 26 y.o.   MRN: [id number]  HPI Started 2 days ago nasal congestion, cough with production, some shortness of breath, mild sore throat, low grade fever 99.7.  No earache, chills.  No sick contacts.  Dayquil and advil with mild relief.    He also complains of acid reflux that bothers him at night.  He wakes up and has reflux to the point that he vomits a little bit.  Denies epigastric pain, bloating, changes in weight.  No hematochezia or melena.  Has not tried GERD medications in past and would like recommendations.  PMHx - Updated and reviewed.  Contributory factors include: Negative PSHx - Updated and reviewed.  Contributory factors include:  Negative FHx - Updated and reviewed.  Contributory factors include:  Negative Social Hx - Updated and reviewed. Contributory factors include: works for a [employer], no smoking history, social alcohol use Medications - reviewed    Review of Systems  Constitutional: Positive for appetite change. Negative for chills, diaphoresis, fatigue, fever and unexpected weight change.  HENT: Positive for congestion, sinus pain and sore throat. Negative for ear pain and trouble swallowing.   Eyes: Negative for pain and itching.  Respiratory: Positive for cough. Negative for chest tightness and shortness of breath.   Cardiovascular: Negative for chest pain, palpitations and leg swelling.  Gastrointestinal: Negative for abdominal pain, diarrhea, nausea and vomiting.  Genitourinary: Negative for dysuria and urgency.  Musculoskeletal: Negative for joint swelling and myalgias.  Skin: Negative for pallor, rash and wound.  Neurological: Negative for dizziness and weakness.  All other systems reviewed and are negative.      Objective:   Physical Exam  Constitutional: He is oriented to person, place, and time. He appears well-developed and well-nourished. No distress.  HENT:  Head: Normocephalic  and atraumatic.  Right Ear: External ear normal.  Left Ear: External ear normal.  Mouth/Throat: No oropharyngeal exudate.  Erythematous and edematous nasal mucosa bilaterally Erythematous pharynx, no exudates  Eyes: Conjunctivae are normal. Right eye exhibits no discharge. Left eye exhibits no discharge. No scleral icterus.  Neck: Normal range of motion. Neck supple.  Cardiovascular: Normal rate, regular rhythm, normal heart sounds and intact distal pulses.  Exam reveals no gallop and no friction rub.   No murmur heard. Pulmonary/Chest: Effort normal and breath sounds normal. No respiratory distress. He has no wheezes. He has no rales. He exhibits no tenderness.  Abdominal: Soft. Bowel sounds are normal. He exhibits no distension and no mass. There is no tenderness. There is no rebound and no guarding.  Musculoskeletal: He exhibits no edema or deformity.  Lymphadenopathy:    He has no cervical adenopathy.  Neurological: He is alert and oriented to person, place, and time.  Skin: Skin is warm. No rash noted. He is not diaphoretic. No erythema. No pallor.  Psychiatric: He has a normal mood and affect. His behavior is normal. Judgment and thought content normal.    BP 112/71   Pulse 73   Temp 98 F (36.7 C) (Oral)   Resp 16   Ht  (1.753 m)   Wt 145 lb (65.8 kg)   SpO2 95%   BMI 21.41 kg/m        Assessment & Plan:  Acute upper respiratory infection Symptomatic care, mucinex, robitussin for nighttime cough.  Follow up PRN.  Reflux esophagitis Trying pantoprazole 30 mins before breakfast.  Follow up 6 weeks  if he cannot get off medication or if it is not improving.  He may need EGD at that time.  No red flag symptoms at this time.  Signed,  Corliss Marcus, DO [facility] 8:49 PM

## 2016-09-29 NOTE — Patient Instructions (Signed)
     IF you received an x-ray today, you will receive an invoice from [facility]. Please contact [facility] at [phone] with questions or concerns regarding your invoice.   IF you received labwork today, you will receive an invoice from [facility]. Please contact [facility] at [phone] with questions or concerns regarding your invoice.   Our billing staff will not be able to assist you with questions regarding bills from these companies.  You will be contacted with the lab results as soon as they are available. The fastest way to get your results is to activate your My Chart account. Instructions are located on the last page of this paperwork. If you have not heard from us regarding the results in 2 weeks, please contact this office.     

## 2017-03-30 DIAGNOSIS — Z23 Encounter for immunization: Secondary | ICD-10-CM | POA: Diagnosis not present

## 2017-07-21 IMAGING — CR DG HAND COMPLETE 3+V*L*
3 series · 3 of 3 positions shown · non-contrast
Comparison: None.

CLINICAL DATA: 24-year-old male with pain over the left fifth
metacarpal

EXAM:
LEFT HAND - COMPLETE 3+ VIEW

[hand pa]
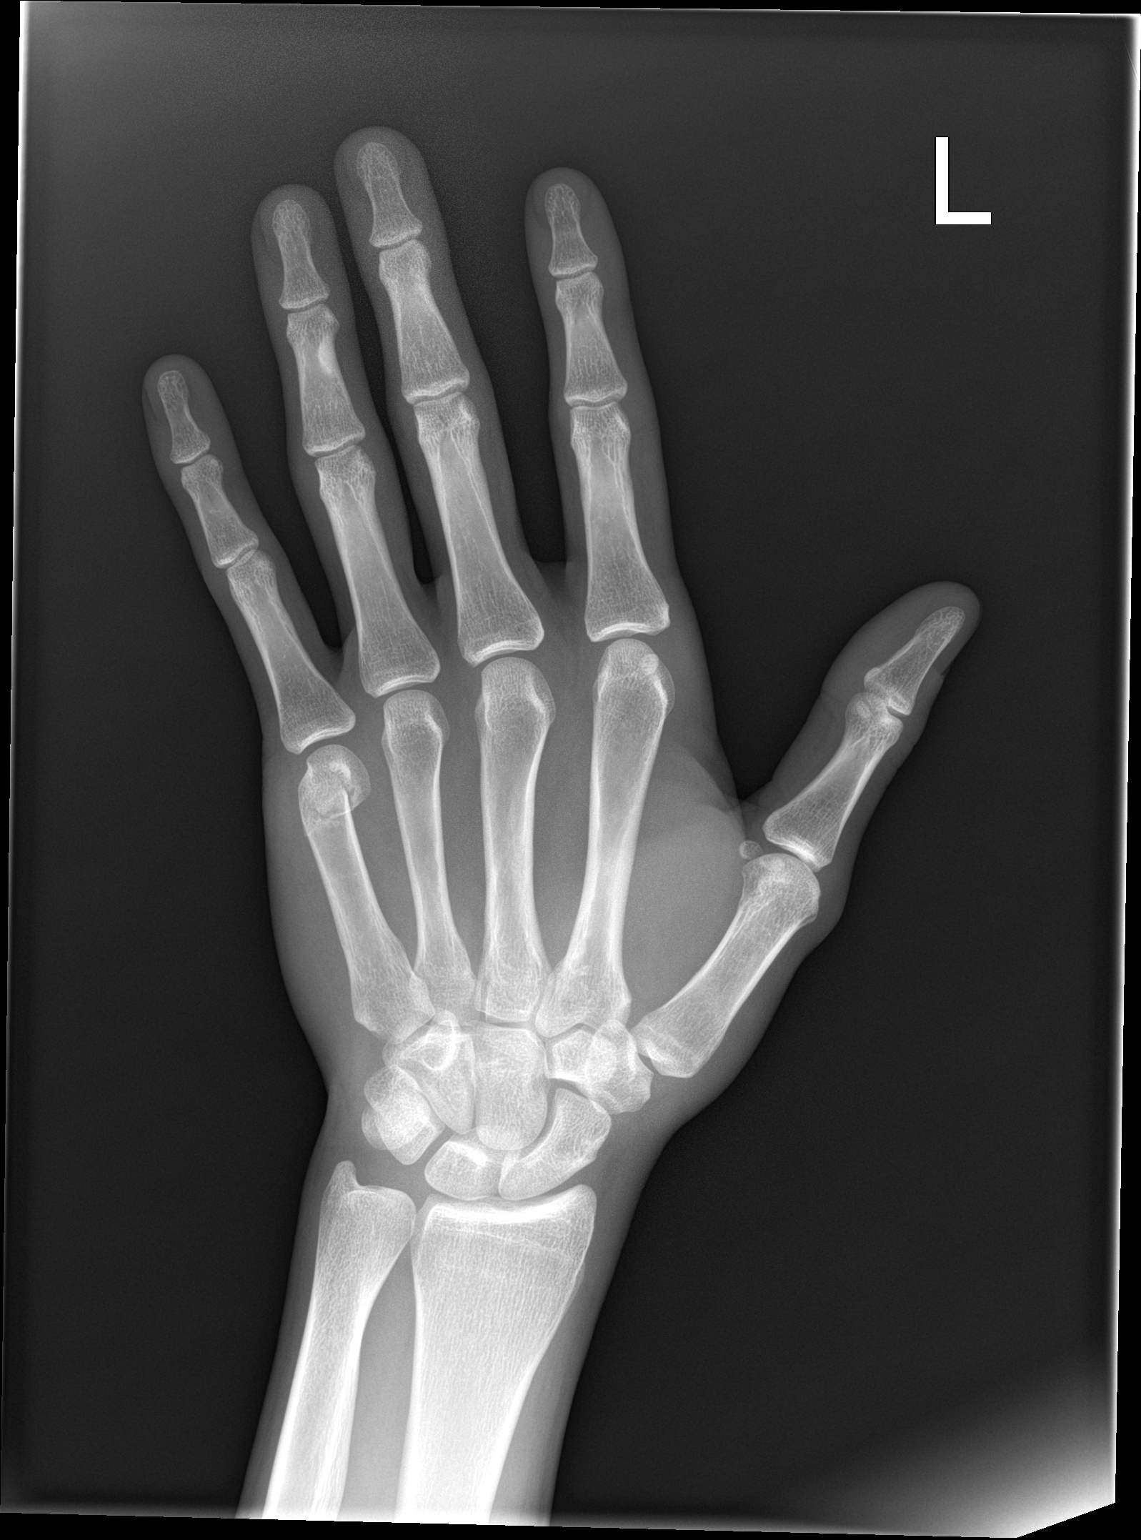

[hand obl]
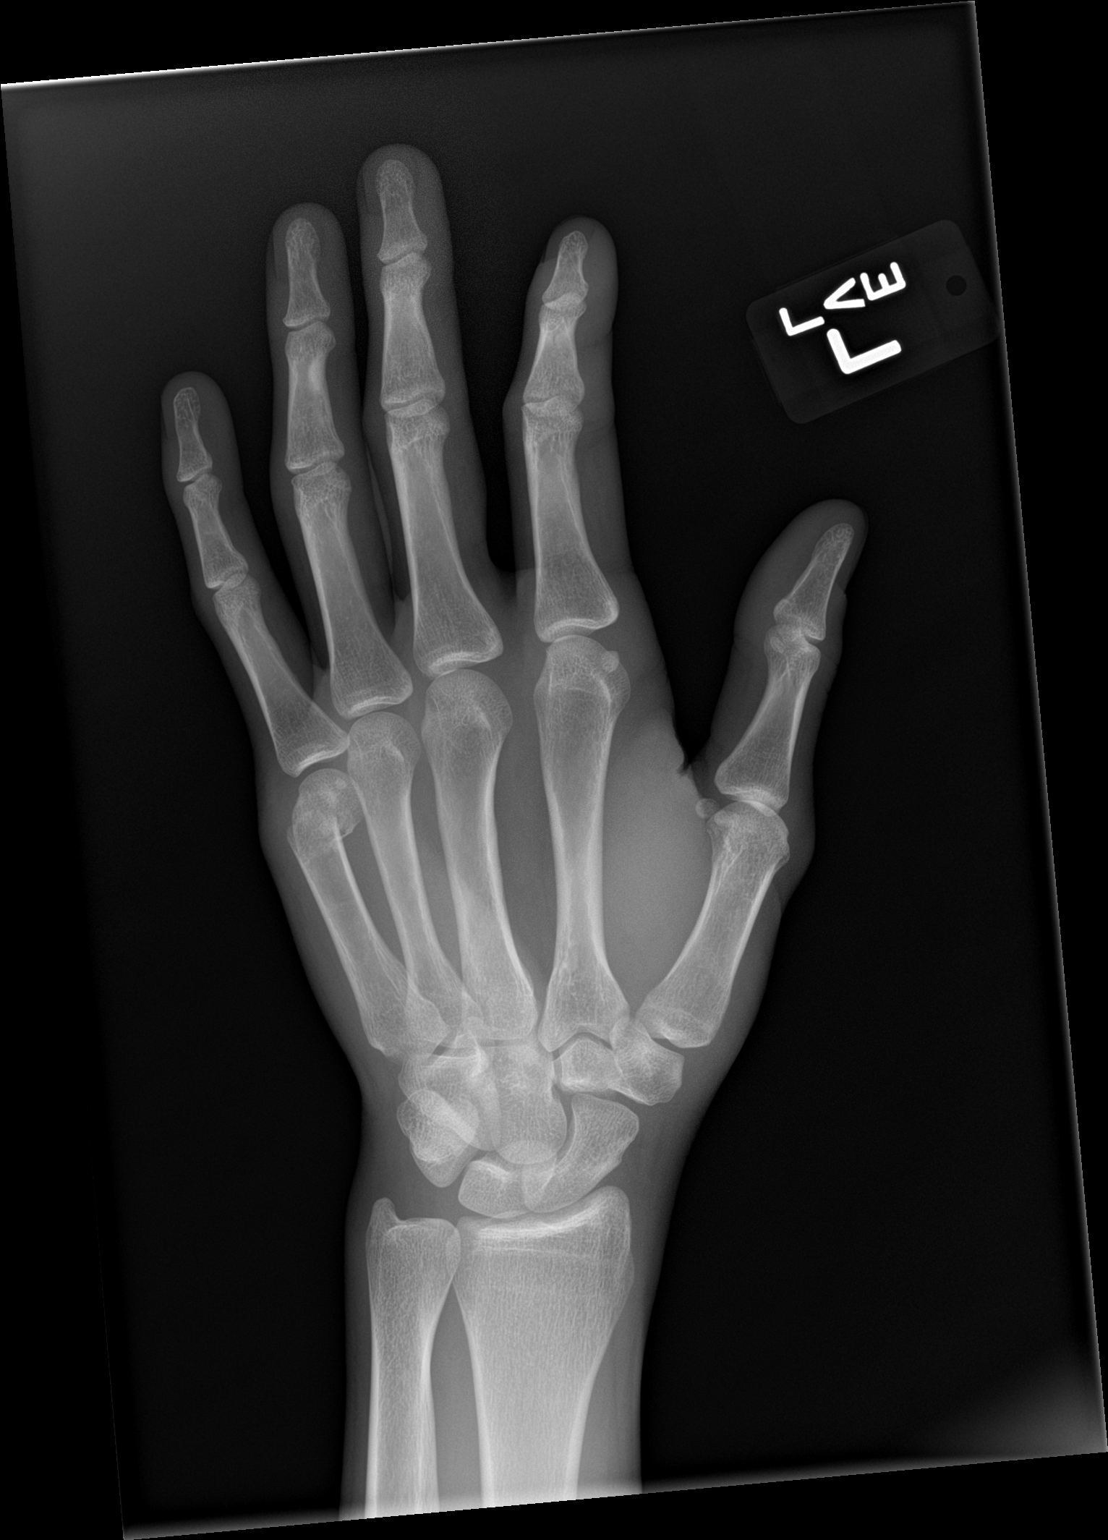

[hand lat]
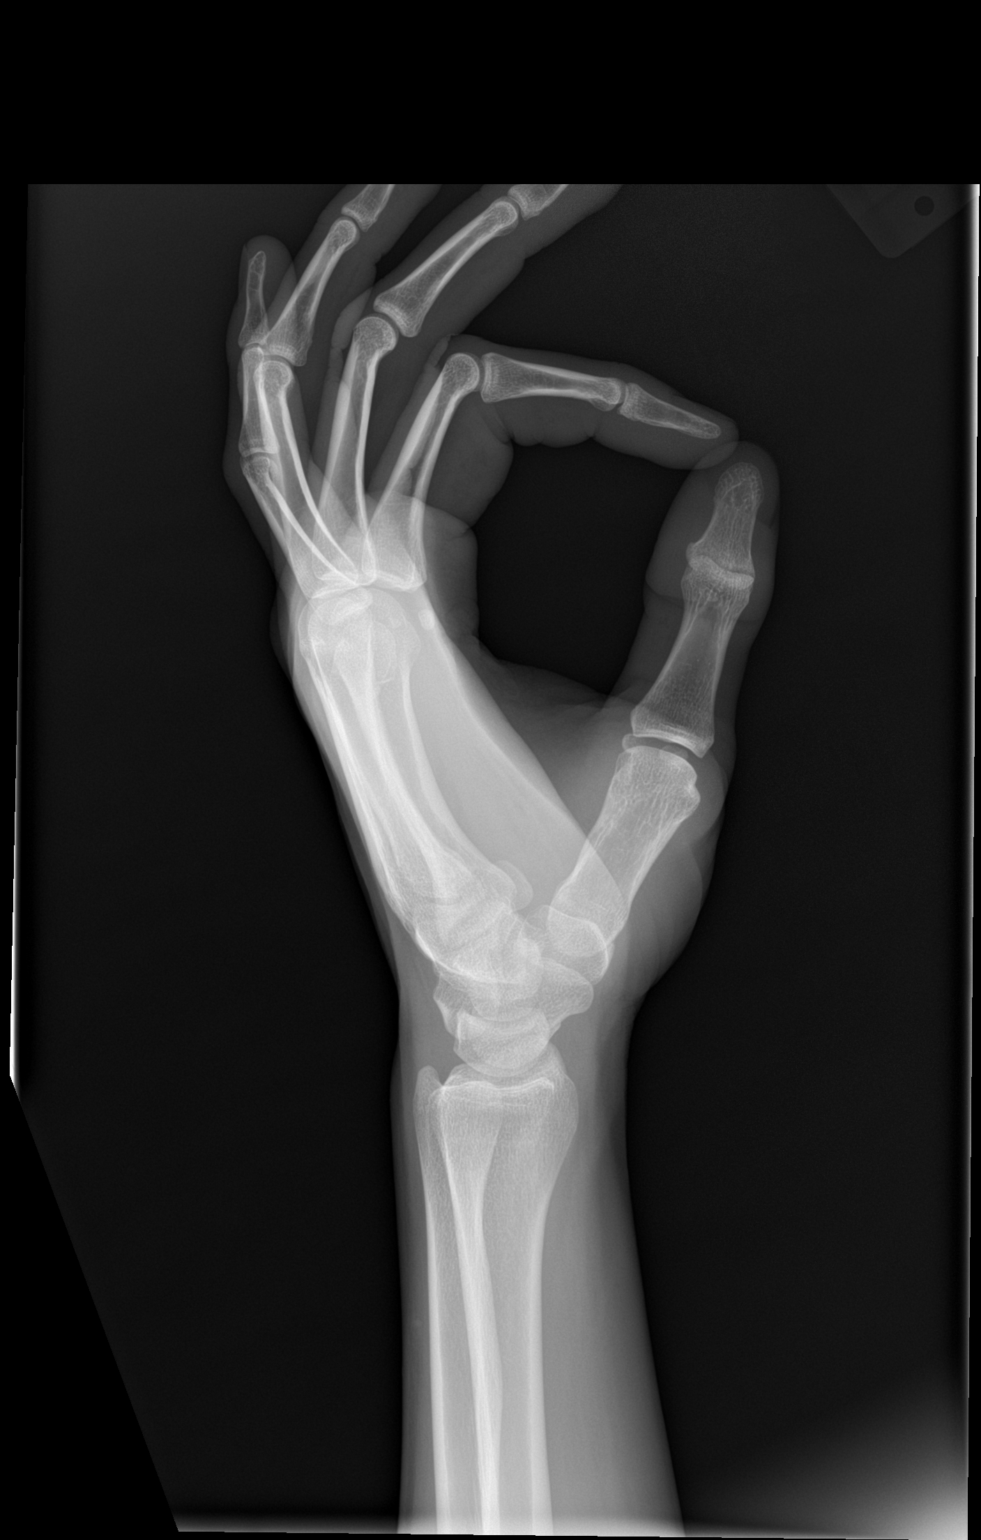

[3 of 3 positions shown; findings below may reference images not displayed]

FINDINGS: There is fracture of the distal fifth metacarpal with volar
angulation of the distal fracture fragment. Common there is soft
tissue swelling over the fifth metacarpal.
IMPRESSION: Fracture of the distal fifth metacarpal.

## 2018-03-12 ENCOUNTER — Ambulatory Visit: Payer: BLUE CROSS/BLUE SHIELD | Admitting: Osteopathic Medicine

## 2018-03-12 ENCOUNTER — Encounter: Payer: Self-pay | Admitting: Osteopathic Medicine

## 2018-03-12 ENCOUNTER — Other Ambulatory Visit: Payer: Self-pay

## 2018-03-12 VITALS — BP 101/58 | HR 77 | Temp 98.1°F | Resp 16 | Ht 69.0 in | Wt 139.6 lb

## 2018-03-12 DIAGNOSIS — K219 Gastro-esophageal reflux disease without esophagitis: Secondary | ICD-10-CM | POA: Diagnosis not present

## 2018-03-12 DIAGNOSIS — K920 Hematemesis: Secondary | ICD-10-CM | POA: Diagnosis not present

## 2018-03-12 MED ORDER — PANTOPRAZOLE SODIUM 40 MG PO TBEC: 40.0000 mg | DELAYED_RELEASE_TABLET | Freq: Two times a day (BID) | ORAL | 1 refills | Status: AC

## 2018-03-12 NOTE — Progress Notes (Signed)
HPI: [name] is a 27 y.o. male who  has no past medical history on file.  he presents to [place] at [facility] today, 03/12/18,  for chief complaint of:  Abdominal pain  . Location: epigastric and lower central abdomen  . Quality: burning pain, sharp stabbing . Duration: several weeks worse but on and off for 10+ years . Timing: random . Modifying factors: worse with acidic foods. Helped a but by Tums, hasn't taken any meds >1 week.  . Assoc signs/symptoms: nausea and vomiting on occasion, few episodes of coffee grounds emesis     Past medical history, surgical history, and family history reviewed.  Current medication list and allergy/intolerance information reviewed.   (See remainder of HPI, ROS, Phys Exam below)    ASSESSMENT/PLAN: The primary encounter diagnosis was Gastroesophageal reflux disease, esophagitis presence not specified. A diagnosis of Coffee ground emesis was also pertinent to this visit.  Orders Placed This Encounter  Procedures  . H. pylori breath test  . CBC  . CMP14+EGFR  . Ambulatory referral to Gastroenterology   Meds ordered this encounter  Medications  . pantoprazole (PROTONIX) 40 MG tablet    Sig: Take 1 tablet (40 mg total) by mouth 2 (two) times daily before a meal.    Dispense:  60 tablet    Refill:  1    Patient Instructions   Plan:  We are going to get a test today for a bacteria called H. pylori which can cause severe acid reflux and stomach ulcers.   If the test is positive for H. pylori, we will call in antibiotics to help treat this.    If it is negative, the definitive test for this is probably going to be done when you see the GI doctor. I  I am also going to get some blood work to check liver/kidney function, electrolytes, sugars, and I am also worried about anemia if there is a slow GI bleed.  I'm referring you to a gastroenterologist  I think you may need a EGD, which is a scope down the throat and into  the stomach to look for ulcers or other reasons for pain.    I am also concerned about the pain that is a little bit lower in the abdomen, as this is not really consistent with stomach problems.  They may suggest doing a colonoscopy as well or other imaging.    If you do not hear back about this referral within the next couple of business days, please call our office and let us know.  In the meantime, I am going to treat with prescription strength antacid medication.    Stay on this medication until a GI doctor tells you to do otherwise.    Take Tums or Pepto-Bismol with this for breakthrough acid reflux type pain.  If you experience severe abdominal pain especially with weakness/faintness, rapid heart rate, severe vomiting especially if vomiting bright red blood, or if blood in the stool or dark dark black stool, please seek emergency medical attention       If you have lab work done today you will be contacted with your lab results within the next 2 weeks.  If you have not heard from [place] then please contact us. The fastest way to get your results is to register for My Chart.   IF you received an x-ray today, you will receive an invoice from [facility]. Please contact [facility] at [phone] with questions or concerns  regarding your invoice.   IF you received labwork today, you will receive an invoice from [facility]. Please contact [facility] at [phone] with questions or concerns regarding your invoice.   Our billing staff will not be able to assist you with questions regarding bills from these companies.  You will be contacted with the lab results as soon as they are available. The fastest way to get your results is to activate your My Chart account. Instructions are located on the last page of this paperwork. If you have not heard from [place] regarding the results in 2 weeks, please contact this office.       Follow-up plan: Return if symptoms worsen or fail  to improve.              ############################################ ############################################ ############################################ ############################################  Past Surgical History:  Procedure Laterality Date  . SPINE SURGERY    . SPLENECTOMY       Outpatient Encounter Medications as of 03/12/2018  Medication Sig  . guaiFENesin-codeine 100-10 MG/5ML syrup Take 5 mLs by mouth every 6 (six) hours as needed for cough. (Patient not taking: Reported on 03/12/2018)  . pantoprazole (PROTONIX) 40 MG tablet Take 1 tablet (40 mg total) by mouth daily. (Patient not taking: Reported on 03/12/2018)   No facility-administered encounter medications on file as of 03/12/2018.    No Known Allergies    Review of Systems:  Constitutional: No fever/chills  HEENT: No  headache, no vision change  Cardiac: No  chest pain, No  pressure, No palpitations  Respiratory:  No  shortness of breath. No  Cough  Gastrointestinal: +abdominal pain - see HPI, no change on bowel habits  Musculoskeletal: No new myalgia/arthralgia  Skin: No  Rash  Hem/Onc: No  easy bruising/bleeding, No  abnormal lumps/bumps  Neurologic: No  weakness, No  Dizziness   Exam:  BP (!) 101/58   Pulse 77   Temp 98.1 F (36.7 C) (Oral)   Resp 16   Ht '5\' 9"'  (1.753 m)   Wt 139 lb 9.6 oz (63.3 kg)   SpO2 95%   BMI 20.62 kg/m   Constitutional: VS see above. General Appearance: alert, well-developed, well-nourished, NAD  Eyes: Normal lids and conjunctive, non-icteric sclera  Ears, Nose, Mouth, Throat: MMM, Normal external inspection ears/nares/mouth/lips/gums.  Neck: No masses, trachea midline.   Respiratory: Normal respiratory effort.   Abdominal: Nontender, nondistended.  Well-healed linear abdominal incision, patient reports from previous spleen surgery.  Bowel sounds within normal limits x4.  Rectal exam deferred.  Musculoskeletal: Gait normal. Symmetric and  independent movement of all extremities  Neurological: Normal balance/coordination. No tremor.  Skin: warm, dry, intact.   Psychiatric: Normal judgment/insight. Normal mood and affect. Oriented x3.   Visit summary with medication list and pertinent instructions was printed for patient to review, advised to alert [place] if any changes needed. All questions at time of visit were answered - patient instructed to contact office with any additional concerns. ER/RTC precautions were reviewed with the patient and understanding verbalized.   Follow-up plan: Return if symptoms worsen or fail to improve.    Please note: voice recognition software was used to produce this document, and typos may escape review. Please contact Dr. Sheppard Coil for any needed clarifications.

## 2018-03-12 NOTE — Patient Instructions (Addendum)
Plan:  We are going to get a test today for a bacteria called H. pylori which can cause severe acid reflux and stomach ulcers.   If the test is positive for H. pylori, we will call in antibiotics to help treat this.    If it is negative, the definitive test for this is probably going to be done when you see the GI doctor. I  I am also going to get some blood work to check liver/kidney function, electrolytes, sugars, and I am also worried about anemia if there is a slow GI bleed.  I'm referring you to a gastroenterologist  I think you may need a EGD, which is a scope down the throat and into the stomach to look for ulcers or other reasons for pain.    I am also concerned about the pain that is a little bit lower in the abdomen, as this is not really consistent with stomach problems.  They may suggest doing a colonoscopy as well or other imaging.    If you do not hear back about this referral within the next couple of business days, please call our office and let us know.  In the meantime, I am going to treat with prescription strength antacid medication.    Stay on this medication until a GI doctor tells you to do otherwise.    Take Tums or Pepto-Bismol with this for breakthrough acid reflux type pain.  If you experience severe abdominal pain especially with weakness/faintness, rapid heart rate, severe vomiting especially if vomiting bright red blood, or if blood in the stool or dark dark black stool, please seek emergency medical attention       If you have lab work done today you will be contacted with your lab results within the next 2 weeks.  If you have not heard from us then please contact us. The fastest way to get your results is to register for My Chart.   IF you received an x-ray today, you will receive an invoice from [facility]. Please contact [facility] at [phone] with questions or concerns regarding your invoice.   IF you received labwork  today, you will receive an invoice from [facility]. Please contact [facility] at [phone] with questions or concerns regarding your invoice.   Our billing staff will not be able to assist you with questions regarding bills from these companies.  You will be contacted with the lab results as soon as they are available. The fastest way to get your results is to activate your My Chart account. Instructions are located on the last page of this paperwork. If you have not heard from us regarding the results in 2 weeks, please contact this office.

## 2018-03-16 LAB — CBC
HEMATOCRIT: 47.3 % (ref 37.5–51.0)
HEMOGLOBIN: 14.9 g/dL (ref 13.0–17.7)
MCH: 32.7 pg (ref 26.6–33.0)
MCHC: 31.5 g/dL (ref 31.5–35.7)
MCV: 104 fL — ABNORMAL HIGH (ref 79–97)
Platelets: 308 10*3/uL (ref 150–450)
RBC: 4.55 x10E6/uL (ref 4.14–5.80)
RDW: 13 % (ref 12.3–15.4)
WBC: 6.8 10*3/uL (ref 3.4–10.8)

## 2018-03-16 LAB — CMP14+EGFR
ALT: 39 IU/L (ref 0–44)
AST: 32 IU/L (ref 0–40)
Albumin/Globulin Ratio: 2.1 (ref 1.2–2.2)
Albumin: 4.7 g/dL (ref 3.5–5.5)
Alkaline Phosphatase: 72 IU/L (ref 39–117)
BUN/Creatinine Ratio: 18 (ref 9–20)
BUN: 15 mg/dL (ref 6–20)
Bilirubin Total: 0.3 mg/dL (ref 0.0–1.2)
CO2: 18 mmol/L — AB (ref 20–29)
Calcium: 9.2 mg/dL (ref 8.7–10.2)
Chloride: 106 mmol/L (ref 96–106)
Creatinine, Ser: 0.83 mg/dL (ref 0.76–1.27)
GFR, EST AFRICAN AMERICAN: 139 mL/min/{1.73_m2} (ref >59)
GFR, EST NON AFRICAN AMERICAN: 121 mL/min/{1.73_m2} (ref >59)
GLUCOSE: 77 mg/dL (ref 65–99)
Globulin, Total: 2.2 g/dL (ref 1.5–4.5)
Potassium: 4.2 mmol/L (ref 3.5–5.2)
Sodium: 141 mmol/L (ref 134–144)
TOTAL PROTEIN: 6.9 g/dL (ref 6.0–8.5)

## 2018-03-16 LAB — H. PYLORI BREATH TEST: H PYLORI BREATH TEST: NEGATIVE

## 2018-04-05 DIAGNOSIS — Z23 Encounter for immunization: Secondary | ICD-10-CM | POA: Diagnosis not present

## 2018-05-02 ENCOUNTER — Encounter: Payer: Self-pay | Admitting: Osteopathic Medicine

## 2018-06-08 ENCOUNTER — Encounter (HOSPITAL_COMMUNITY): Payer: Self-pay | Admitting: Emergency Medicine

## 2018-06-08 ENCOUNTER — Ambulatory Visit (HOSPITAL_COMMUNITY)
Admission: EM | Admit: 2018-06-08 | Discharge: 2018-06-08 | Disposition: A | Discharge status: Home / Self Care | Payer: BLUE CROSS/BLUE SHIELD | Attending: Family Medicine | Admitting: Family Medicine

## 2018-06-08 ENCOUNTER — Ambulatory Visit: Payer: Self-pay

## 2018-06-08 ENCOUNTER — Other Ambulatory Visit: Payer: Self-pay

## 2018-06-08 DIAGNOSIS — R112 Nausea with vomiting, unspecified: Secondary | ICD-10-CM

## 2018-06-08 DIAGNOSIS — R1030 Lower abdominal pain, unspecified: Secondary | ICD-10-CM | POA: Diagnosis not present

## 2018-06-08 MED ORDER — ONDANSETRON 4 MG PO TBDP
ORAL_TABLET | ORAL | Status: AC
  Filled 2018-06-08: qty 1

## 2018-06-08 MED ORDER — ONDANSETRON 4 MG PO TBDP: 4.0000 mg | ORAL_TABLET | Freq: Three times a day (TID) | ORAL | 0 refills | Status: AC | PRN

## 2018-06-08 MED ORDER — ONDANSETRON 4 MG PO TBDP
4.0000 mg | ORAL_TABLET | Freq: Once | ORAL | Status: AC
  Administered 2018-06-08: 4 mg via ORAL

## 2018-06-08 MED ORDER — KETOROLAC TROMETHAMINE 30 MG/ML IJ SOLN
30.0000 mg | Freq: Once | INTRAMUSCULAR | Status: AC
  Administered 2018-06-08: 30 mg via INTRAMUSCULAR

## 2018-06-08 MED ORDER — KETOROLAC TROMETHAMINE 30 MG/ML IJ SOLN
INTRAMUSCULAR | Status: AC
  Filled 2018-06-08: qty 1

## 2018-06-08 NOTE — Telephone Encounter (Signed)
Pt. Reports abdominal pain woke him up in the middle of the night. Pain is midline, low abdominal area. Does not radiate. Vomited x 2. Did have a bowel movement this morning. No availability at office today. Will go to to UC. Reason for Disposition . [1] MILD-MODERATE pain AND [2] constant AND [3] present > 2 hours  Answer Assessment - Initial Assessment Questions 1. LOCATION: "Where does it hurt?"      Low abd. 2. RADIATION: "Does the pain shoot anywhere else?" (e.g., chest, back)     No 3. ONSET: "When did the pain begin?" (Minutes, hours or days ago)      Last night 4. SUDDEN: "Gradual or sudden onset?"     Sudden 5. PATTERN "Does the pain come and go, or is it constant?"    - If constant: "Is it getting better, staying the same, or worsening?"      (Note: Constant means the pain never goes away completely; most serious pain is constant and it progresses)     - If intermittent: "How long does it last?" "Do you have pain now?"     (Note: Intermittent means the pain goes away completely between bouts)     Constant 6. SEVERITY: "How bad is the pain?"  (e.g., Scale 1-10; mild, moderate, or severe)    - MILD (1-3): doesn't interfere with normal activities, abdomen soft and not tender to touch     - MODERATE (4-7): interferes with normal activities or awakens from sleep, tender to touch     - SEVERE (8-10): excruciating pain, doubled over, unable to do any normal activities       8 7. RECURRENT SYMPTOM: "Have you ever had this type of abdominal pain before?" If so, ask: "When was the last time?" and "What happened that time?"      Yes 8. CAUSE: "What do you think is causing the abdominal pain?"     Unsure 9. RELIEVING/AGGRAVATING FACTORS: "What makes it better or worse?" (e.g., movement, antacids, bowel movement)     Nothing 10. OTHER SYMPTOMS: "Has there been any vomiting, diarrhea, constipation, or urine problems?"       Vomited twice  Protocols used: ABDOMINAL PAIN - MALE-A-AH

## 2018-06-08 NOTE — ED Triage Notes (Signed)
PT reports lower abdominal pain that started last night at 0100. PT reports several episodes of vomiting as well.

## 2018-06-08 NOTE — Discharge Instructions (Signed)
We gave you a shot of Toradol today to help with the pain  For nausea: Zofran prescribed. Begin with every 6 hours, than as you are able to hold food down, take it as needed. Start with clear liquids, then move to plain foods like bananas, rice, applesauce, toast, broth, grits, oatmeal. As those food settle okay you may transition to your normal foods. Avoid spicy and greasy foods as much as possible.  Preventing dehydration is key! You need to replace the fluid your body is expelling. Drink plenty of fluids, may use Pedialyte or sports drinks.   Please return if you are experiencing blood in your vomit or stool or experiencing dizziness, lightheadedness, extreme fatigue, increased abdominal pain, fever, pain different from your normal episodes

## 2018-06-08 NOTE — ED Provider Notes (Signed)
[facility]    CSN: [id number] Arrival date & time: 06/08/18  0813     History   Chief Complaint Chief Complaint  Patient presents with  . Abdominal Pain  . Emesis    HPI [name] is a 27 y.o. male history of previous ruptured spleen, GERD, presenting today for evaluation of lower abdominal pain and vomiting.  Patient states that symptoms began early this morning.  Abdominal pain is located in his lower abdomen and is both sharp and achy.  Typically approximately 8 out of 10, but at times will increase to 10 out of 10.  Has associated vomiting.  Denies diarrhea.  Had a normal bowel movement earlier this morning and was normal consistency and amount.  Denies associated URI symptoms.  States that he is dealt with episodes of this similarly in the past.  He was evaluated by his primary care, but has never seen a gastroenterologist.  He states this feels very similar to his previous episodes.  Typically pain is intense for approximately 24 hours and then is dull and subsides.  Poor oral intake, since symptoms began today.  HPI  History reviewed. No pertinent past medical history.  Patient Active Problem List   Diagnosis Date Noted  . Coffee ground emesis 03/12/2018  . Gastroesophageal reflux disease 03/12/2018  . Acute upper respiratory infection 09/29/2016  . 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 Reflux esophagitis 07/26/2012    Past Surgical History:  Procedure Laterality Date  . SPINE SURGERY    . SPLENECTOMY         Home Medications    Prior to Admission medications   Medication Sig Start Date End Date Taking? Authorizing Provider  guaiFENesin-codeine 100-10 MG/5ML syrup Take 5 mLs by mouth every 6 (six) hours as needed for cough. Patient not taking: Reported on 03/12/2018 09/29/16   Chitanand, Alicia B, DO  ondansetron (ZOFRAN ODT) 4 MG disintegrating tablet Take 1 tablet (4 mg total) by mouth every 8 (eight) hours as needed for nausea or vomiting. 06/08/18    Shavell Nored C, PA-C  pantoprazole (PROTONIX) 40 MG tablet Take 1 tablet (40 mg total) by mouth 2 (two) times daily before a meal. 03/12/18   Sunnie NielsenAlexander, Natalie, DO    Family History No family history on file.  Social History Social History   Tobacco Use  . Smoking status: Current Every Day Smoker    Packs/day: 0.50    Years: 5.00    Pack years: 2.50    Types: Cigarettes  . Smokeless tobacco: Never Used  Substance Use Topics  . Alcohol use: Yes    Alcohol/week: 6.0 standard drinks    Types: 6 Cans of beer per week  . Drug use: No     Allergies   Patient has no known allergies.   Review of Systems Review of Systems  Constitutional: Negative for activity change, appetite change, chills, fatigue and fever.  HENT: Negative for congestion, ear pain, rhinorrhea, sinus pressure, sore throat and trouble swallowing.   Eyes: Negative for discharge and redness.  Respiratory: Negative for cough, chest tightness and shortness of breath.   Cardiovascular: Negative for chest pain.  Gastrointestinal: Positive for abdominal pain, nausea and vomiting. Negative for diarrhea.  Musculoskeletal: Negative for myalgias.  Skin: Negative for rash.  Neurological: Negative for dizziness, light-headedness and headaches.     Physical Exam Triage Vital Signs ED Triage Vitals  Enc Vitals Group     BP 06/08/18 0829 127/88     Pulse  Rate 06/08/18 0829 62     Resp 06/08/18 0829 16     Temp 06/08/18 0829 97.6 F (36.4 C)     Temp Source 06/08/18 0829 Oral     SpO2 06/08/18 0829 98 %     Weight --      Height --      Head Circumference --      Peak Flow --      Pain Score 06/08/18 0831 8     Pain Loc --      Pain Edu? --      Excl. in GC? --    No data found.  Updated Vital Signs BP 127/88 (BP Location: Right Arm)   Pulse 62   Temp 97.6 F (36.4 C) (Oral)   Resp 16   SpO2 98%   Visual Acuity Right Eye Distance:   Left Eye Distance:   Bilateral Distance:    Right Eye Near:     Left Eye Near:    Bilateral Near:     Physical Exam Vitals signs and nursing note reviewed.  Constitutional:      Appearance: He is well-developed.     Comments: No acute distress, nontoxic-appearing  HENT:     Head: Normocephalic and atraumatic.     Mouth/Throat:     Comments: Oral mucosa pink and moist, no tonsillar enlargement or exudate. Posterior pharynx patent and nonerythematous, no uvula deviation or swelling. Normal phonation. Eyes:     Extraocular Movements: Extraocular movements intact.     Conjunctiva/sclera: Conjunctivae normal.     Pupils: Pupils are equal, round, and reactive to light.  Neck:     Musculoskeletal: Neck supple.  Cardiovascular:     Rate and Rhythm: Normal rate and regular rhythm.     Heart sounds: No murmur.  Pulmonary:     Effort: Pulmonary effort is normal. No respiratory distress.     Breath sounds: Normal breath sounds.     Comments: Breathing comfortably at rest, CTABL, no wheezing, rales or other adventitious sounds auscultated Abdominal:     Palpations: Abdomen is soft.     Tenderness: There is abdominal tenderness.     Comments: Abdomen soft, nondistended, tender to palpation to bilateral lower quadrants, no focal tenderness, negative rebound, negative Rovsing, negative McBurney's.  One episode of vomiting in room, nonbilious, nonbloody appearing  Skin:    General: Skin is warm and dry.  Neurological:     Mental Status: He is alert.      UC Treatments / Results  Labs (all labs ordered are listed, but only abnormal results are displayed) Labs Reviewed - No data to display  EKG None  Radiology No results found.  Procedures Procedures (including critical care time)  Medications Ordered in UC Medications  ondansetron (ZOFRAN-ODT) disintegrating tablet 4 mg (4 mg Oral Given 06/08/18 0925)  ketorolac (TORADOL) 30 MG/ML injection 30 mg (30 mg Intramuscular Given 06/08/18 0924)    Initial Impression / Assessment and Plan / UC  Course  I have reviewed the triage vital signs and the nursing notes.  Pertinent labs & imaging results that were available during my care of the patient were reviewed by me and considered in my medical decision making (see chart for details).     Patient with acute episode of abdominal pain and vomiting, vital signs stable, no fever, similar to previous episodes.  Will treat symptomatically and supportively.  Will provide Zofran to use as needed to better control nausea and vomiting in attempts  to increase oral intake and oral rehydration.  Will provide Toradol IM to help with pain and bypass GI tract.  Discussed following up with gastroenterology for further evaluation of symptoms.Discussed strict return precautions. Patient verbalized understanding and is agreeable with plan.  Final Clinical Impressions(s) / UC Diagnoses   Final diagnoses:  Lower abdominal pain  Non-intractable vomiting with nausea, unspecified vomiting type     Discharge Instructions     We gave you a shot of Toradol today to help with the pain  For nausea: Zofran prescribed. Begin with every 6 hours, than as you are able to hold food down, take it as needed. Start with clear liquids, then move to plain foods like bananas, rice, applesauce, toast, broth, grits, oatmeal. As those food settle okay you may transition to your normal foods. Avoid spicy and greasy foods as much as possible.  Preventing dehydration is key! You need to replace the fluid your body is expelling. Drink plenty of fluids, may use Pedialyte or sports drinks.   Please return if you are experiencing blood in your vomit or stool or experiencing dizziness, lightheadedness, extreme fatigue, increased abdominal pain, fever, pain different from your normal episodes   ED Prescriptions    Medication Sig Dispense Auth. Provider   ondansetron (ZOFRAN ODT) 4 MG disintegrating tablet Take 1 tablet (4 mg total) by mouth every 8 (eight) hours as needed for  nausea or vomiting. 20 tablet Yazhini Mcaulay, [place] C, PA-C     Controlled Substance Prescriptions [place] Controlled Substance Registry consulted? Not Applicable   Lew Dawes, [place] 06/08/18 1312

## 2018-07-11 DIAGNOSIS — F411 Generalized anxiety disorder: Secondary | ICD-10-CM | POA: Diagnosis not present

## 2018-07-26 DIAGNOSIS — F411 Generalized anxiety disorder: Secondary | ICD-10-CM | POA: Diagnosis not present

## 2018-08-09 DIAGNOSIS — F411 Generalized anxiety disorder: Secondary | ICD-10-CM | POA: Diagnosis not present

## 2018-08-23 DIAGNOSIS — F411 Generalized anxiety disorder: Secondary | ICD-10-CM | POA: Diagnosis not present

## 2018-11-10 DIAGNOSIS — L821 Other seborrheic keratosis: Secondary | ICD-10-CM | POA: Diagnosis not present

## 2018-11-10 DIAGNOSIS — B078 Other viral warts: Secondary | ICD-10-CM | POA: Diagnosis not present

## 2019-01-11 DIAGNOSIS — F411 Generalized anxiety disorder: Secondary | ICD-10-CM | POA: Diagnosis not present

## 2019-01-23 DIAGNOSIS — F411 Generalized anxiety disorder: Secondary | ICD-10-CM | POA: Diagnosis not present

## 2019-02-02 DIAGNOSIS — F411 Generalized anxiety disorder: Secondary | ICD-10-CM | POA: Diagnosis not present

## 2019-02-14 DIAGNOSIS — F411 Generalized anxiety disorder: Secondary | ICD-10-CM | POA: Diagnosis not present

## 2019-03-07 DIAGNOSIS — Z23 Encounter for immunization: Secondary | ICD-10-CM | POA: Diagnosis not present

## 2019-04-03 DIAGNOSIS — Z20828 Contact with and (suspected) exposure to other viral communicable diseases: Secondary | ICD-10-CM | POA: Diagnosis not present

## 2019-04-27 ENCOUNTER — Other Ambulatory Visit: Payer: Self-pay

## 2019-04-27 DIAGNOSIS — Z20822 Contact with and (suspected) exposure to covid-19: Secondary | ICD-10-CM

## 2019-04-29 LAB — NOVEL CORONAVIRUS, NAA: SARS-CoV-2, NAA: NOT DETECTED

## 2019-10-09 DIAGNOSIS — L089 Local infection of the skin and subcutaneous tissue, unspecified: Secondary | ICD-10-CM | POA: Diagnosis not present

## 2019-10-09 DIAGNOSIS — R21 Rash and other nonspecific skin eruption: Secondary | ICD-10-CM | POA: Diagnosis not present

## 2021-04-02 DIAGNOSIS — R112 Nausea with vomiting, unspecified: Secondary | ICD-10-CM | POA: Diagnosis not present

## 2021-04-02 DIAGNOSIS — F419 Anxiety disorder, unspecified: Secondary | ICD-10-CM | POA: Diagnosis not present

## 2021-04-02 DIAGNOSIS — Z0000 Encounter for general adult medical examination without abnormal findings: Secondary | ICD-10-CM | POA: Diagnosis not present

## 2021-04-02 DIAGNOSIS — Z113 Encounter for screening for infections with a predominantly sexual mode of transmission: Secondary | ICD-10-CM | POA: Diagnosis not present

## 2021-04-02 DIAGNOSIS — F1010 Alcohol abuse, uncomplicated: Secondary | ICD-10-CM | POA: Diagnosis not present

## 2021-04-02 DIAGNOSIS — R1013 Epigastric pain: Secondary | ICD-10-CM | POA: Diagnosis not present

## 2021-04-02 DIAGNOSIS — Z1159 Encounter for screening for other viral diseases: Secondary | ICD-10-CM | POA: Diagnosis not present

## 2021-04-02 DIAGNOSIS — D7589 Other specified diseases of blood and blood-forming organs: Secondary | ICD-10-CM | POA: Diagnosis not present

## 2021-04-02 DIAGNOSIS — Z13220 Encounter for screening for lipoid disorders: Secondary | ICD-10-CM | POA: Diagnosis not present

## 2021-12-22 DIAGNOSIS — R1013 Epigastric pain: Secondary | ICD-10-CM | POA: Diagnosis not present

## 2021-12-22 DIAGNOSIS — F321 Major depressive disorder, single episode, moderate: Secondary | ICD-10-CM | POA: Diagnosis not present

## 2021-12-22 DIAGNOSIS — F411 Generalized anxiety disorder: Secondary | ICD-10-CM | POA: Diagnosis not present

## 2022-02-26 ENCOUNTER — Ambulatory Visit: Payer: BLUE CROSS/BLUE SHIELD | Admitting: Medical-Surgical

## 2022-07-26 DIAGNOSIS — S0269XA Fracture of mandible of other specified site, initial encounter for closed fracture: Secondary | ICD-10-CM | POA: Diagnosis not present

## 2022-07-26 DIAGNOSIS — X58XXXA Exposure to other specified factors, initial encounter: Secondary | ICD-10-CM | POA: Diagnosis not present

## 2022-07-26 DIAGNOSIS — S02609A Fracture of mandible, unspecified, initial encounter for closed fracture: Secondary | ICD-10-CM | POA: Diagnosis not present

## 2022-11-08 ENCOUNTER — Other Ambulatory Visit: Payer: Self-pay

## 2022-11-08 ENCOUNTER — Emergency Department (HOSPITAL_COMMUNITY)
Admission: EM | Admit: 2022-11-08 | Discharge: 2022-11-09 | Disposition: A | Discharge status: Home / Self Care | Payer: BLUE CROSS/BLUE SHIELD | Attending: Emergency Medicine | Admitting: Emergency Medicine

## 2022-11-08 ENCOUNTER — Encounter (HOSPITAL_COMMUNITY): Payer: Self-pay | Admitting: *Deleted

## 2022-11-08 DIAGNOSIS — S61210A Laceration without foreign body of right index finger without damage to nail, initial encounter: Secondary | ICD-10-CM | POA: Insufficient documentation

## 2022-11-08 DIAGNOSIS — W268XXA Contact with other sharp object(s), not elsewhere classified, initial encounter: Secondary | ICD-10-CM | POA: Insufficient documentation

## 2022-11-08 MED ORDER — OXYCODONE-ACETAMINOPHEN 5-325 MG PO TABS
1.0000 | ORAL_TABLET | ORAL | Status: DC | PRN
  Administered 2022-11-08: 1 via ORAL
  Filled 2022-11-08: qty 1

## 2022-11-08 NOTE — ED Triage Notes (Signed)
Pt states was sweeping the floor and the metal broom handle broke and cut his R pointer finger.  2 lacs noted.  Bleeding controlled when dressing placed.

## 2022-11-09 MED ORDER — LIDOCAINE HCL (PF) 1 % IJ SOLN
30.0000 mL | Freq: Once | INTRAMUSCULAR | Status: AC
  Administered 2022-11-09: 30 mL via INTRADERMAL
  Filled 2022-11-09: qty 30

## 2022-11-09 NOTE — ED Provider Notes (Signed)
[name] EMERGENCY DEPARTMENT AT [facility] Provider Note   CSN: [id number] Arrival date & time: 11/08/22  2146     History  Chief Complaint  Patient presents with  . Laceration    [name] is a 32 y.o. male.  The history is provided by the patient and medical records.  Laceration  32 y.o. M here with right index finger lacerations.  States he was sweeping with metal broom and handle bent.  He tried to straighten it back out and it snapped cutting his finger twice.  Tetanus UTD.  Home Medications Prior to Admission medications   Medication Sig Start Date End Date Taking? Authorizing Provider  guaiFENesin-codeine 100-10 MG/5ML syrup Take 5 mLs by mouth every 6 (six) hours as needed for cough. Patient not taking: Reported on 03/12/2018 09/29/16   Chitanand, Alicia B, DO  ondansetron (ZOFRAN ODT) 4 MG disintegrating tablet Take 1 tablet (4 mg total) by mouth every 8 (eight) hours as needed for nausea or vomiting. 06/08/18   Wieters, Hallie C, PA-C  pantoprazole (PROTONIX) 40 MG tablet Take 1 tablet (40 mg total) by mouth 2 (two) times daily before a meal. 03/12/18   Sunnie Nielsen, DO      Allergies    Patient has no known allergies.    Review of Systems   Review of Systems  Skin:  Positive for wound.  All other systems reviewed and are negative.   Physical Exam Updated Vital Signs BP 110/80 (BP Location: Right Arm)   Pulse 70   Temp 97.9 F (36.6 C) (Oral)   Resp 16   Ht 5\' 9"  (1.753 m)   Wt 72.6 kg   SpO2 98%   BMI 23.63 kg/m  Physical Exam Vitals and nursing note reviewed.  Constitutional:      Appearance: He is well-developed.  HENT:     Head: Normocephalic and atraumatic.  Eyes:     Conjunctiva/sclera: Conjunctivae normal.     Pupils: Pupils are equal, round, and reactive to light.  Cardiovascular:     Rate and Rhythm: Normal rate and regular rhythm.     Heart sounds: Normal heart sounds.  Pulmonary:     Effort: Pulmonary effort  is normal.     Breath sounds: Normal breath sounds.  Abdominal:     General: Bowel sounds are normal.     Palpations: Abdomen is soft.  Musculoskeletal:        General: Normal range of motion.       Hands:     Cervical back: Normal range of motion.     Comments: Right index finger with 2 semi-circular, flap type laceration along proximal phalanx and middle phalanx, small superior and measuring 2cm, large is inferior and measuring around 4cm palmar surface; there is some oozing from smaller wound but controlled with dressing in place, able to flex/extend, no deep tissue, vessel, or tendon involvement, normal cap refill, normal sensation  Skin:    General: Skin is warm and dry.  Neurological:     Mental Status: He is alert and oriented to person, place, and time.    ED Results / Procedures / Treatments   Labs (all labs ordered are listed, but only abnormal results are displayed) Labs Reviewed - No data to display  EKG None  Radiology No results found.  Procedures Procedures    LACERATION REPAIR Performed by: Garlon Hatchet Authorized by: Garlon Hatchet Consent: Verbal consent obtained. Risks and benefits: risks, benefits and alternatives  were discussed Consent given by: patient Patient identity confirmed: provided demographic data Prepped and Draped in normal sterile fashion Wound explored  Laceration Location: right index finger  Laceration Length: 2cm  No Foreign Bodies seen or palpated  Anesthesia: local infiltration  Local anesthetic: lidocaine 1% without epinephrine  Anesthetic total: 2 ml  Irrigation method: syringe Amount of cleaning: standard  Skin closure: 4-0 prolene  Number of sutures: 3  Technique: simple interrupted  Patient tolerance: Patient tolerated the procedure well with no immediate complications.  LACERATION REPAIR Performed by: Garlon Hatchet Authorized by: Garlon Hatchet Consent: Verbal consent obtained. Risks and benefits:  risks, benefits and alternatives were discussed Consent given by: patient Patient identity confirmed: provided demographic data Prepped and Draped in normal sterile fashion Wound explored  Laceration Location: right index finger  Laceration Length: 4cm  No Foreign Bodies seen or palpated  Anesthesia: local infiltration  Local anesthetic: lidocaine 1% without epinephrine  Anesthetic total: 4 ml  Irrigation method: syringe Amount of cleaning: standard  Skin closure: 4-0 prolene  Number of sutures: 5  Technique: simple interrupted  Patient tolerance: Patient tolerated the procedure well with no immediate complications.   Medications Ordered in ED Medications  oxyCODONE-acetaminophen (PERCOCET/ROXICET) 5-325 MG per tablet 1 tablet (1 tablet Oral Given 11/08/22 2203)  lidocaine (PF) (XYLOCAINE) 1 % injection 30 mL (30 mLs Intradermal Given 11/09/22 0400)    ED Course/ Medical Decision Making/ A&P                             Medical Decision Making Risk Prescription drug management.   32 year old male here with 2 lacerations to right index finger from a broken metal broom handle.  Both lacerations are semicircular, flap type involving palmar aspect only.  There is no deep tissue, vessel, or tendon involvement.  He maintains full range of motion of the finger.  Tetanus is up-to-date.  Laceration repaired as above, tolerated well.  Discussed home wound care instructions and follow-up with PCP in 1 week for suture removal.  He can return here for any new or acute changes.  Final Clinical Impression(s) / ED Diagnoses Final diagnoses:  Laceration of right index finger without foreign body without damage to nail, initial encounter    Rx / DC Orders ED Discharge Orders     None         Garlon Hatchet, PA-C 11/09/22 0435    Nira Conn, MD 11/09/22 [phone]

## 2022-11-09 NOTE — Discharge Instructions (Addendum)
Try to keep sutures clean and dry.  They will need to be removed in about 1 week, your primary care doctor can do this for you. Monitor for any redness, drainage, swelling, or fever. Return here for any new or acute changes.
